# Patient Record
Sex: Female | Born: 2000 | Hispanic: No | Marital: Single | Smoking: Former smoker
Health system: Southern US, Community
[De-identification: ages and names within clinical notes are randomized; demographics above are authoritative.]

## PROBLEM LIST (undated history)

## (undated) DIAGNOSIS — F419 Anxiety disorder, unspecified: Secondary | ICD-10-CM

## (undated) DIAGNOSIS — N83209 Unspecified ovarian cyst, unspecified side: Secondary | ICD-10-CM

## (undated) DIAGNOSIS — Z8614 Personal history of Methicillin resistant Staphylococcus aureus infection: Secondary | ICD-10-CM

## (undated) DIAGNOSIS — Z09 Encounter for follow-up examination after completed treatment for conditions other than malignant neoplasm: Secondary | ICD-10-CM

## (undated) DIAGNOSIS — R569 Unspecified convulsions: Secondary | ICD-10-CM

## (undated) DIAGNOSIS — N83201 Unspecified ovarian cyst, right side: Secondary | ICD-10-CM

## (undated) DIAGNOSIS — Z8776 Personal history of (corrected) congenital malformations of integument, limbs and musculoskeletal system: Secondary | ICD-10-CM

## (undated) DIAGNOSIS — N83202 Unspecified ovarian cyst, left side: Secondary | ICD-10-CM

## (undated) DIAGNOSIS — Q793 Gastroschisis: Secondary | ICD-10-CM

## (undated) DIAGNOSIS — Z87761 Personal history of (corrected) gastroschisis: Secondary | ICD-10-CM

## (undated) DIAGNOSIS — M1990 Unspecified osteoarthritis, unspecified site: Secondary | ICD-10-CM

## (undated) DIAGNOSIS — T7840XA Allergy, unspecified, initial encounter: Secondary | ICD-10-CM

## (undated) DIAGNOSIS — J45909 Unspecified asthma, uncomplicated: Secondary | ICD-10-CM

## (undated) HISTORY — PX: APPENDECTOMY: SHX54

## (undated) HISTORY — PX: SMALL INTESTINE SURGERY: SHX150

## (undated) HISTORY — PX: ADENOIDECTOMY: SUR15

## (undated) HISTORY — PX: NOSE SURGERY: SHX723

## (undated) HISTORY — PX: GASTROSCHISIS CLOSURE: SHX1700

## (undated) HISTORY — PX: TONSILLECTOMY: SUR1361

---
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
## 2018-01-29 ENCOUNTER — Encounter: Payer: Self-pay | Admitting: Obstetrics and Gynecology

## 2018-01-29 ENCOUNTER — Ambulatory Visit (INDEPENDENT_AMBULATORY_CARE_PROVIDER_SITE_OTHER): Payer: No Typology Code available for payment source | Admitting: Obstetrics and Gynecology

## 2018-01-29 DIAGNOSIS — Z0000 Encounter for general adult medical examination without abnormal findings: Secondary | ICD-10-CM | POA: Diagnosis not present

## 2018-01-29 DIAGNOSIS — Z01411 Encounter for gynecological examination (general) (routine) with abnormal findings: Secondary | ICD-10-CM

## 2018-01-29 DIAGNOSIS — Z113 Encounter for screening for infections with a predominantly sexual mode of transmission: Secondary | ICD-10-CM

## 2018-01-29 NOTE — Progress Notes (Signed)
Patient ID: Sheryl Dixon, female   DOB: 23-Aug-2001, 17 y.o.   MRN: [id number]     Gynecology Annual Exam  PCP: [facility], [facility]  Chief Complaint:  Chief Complaint  Patient presents with  . Follow-up    endometriosis    History of Present Illness: Patient is a 17 y.o. No obstetric history on file. presents for annual exam. The patient has no complaints today.   LMP: Patient's last menstrual period was 01/29/2018 (exact date). Average Interval: regular, 28 days Intermenstrual Bleeding: no Postcoital Bleeding: no Dysmenorrhea: yes  The patient is sexually active. She currently uses none for contraception. She denies dyspareunia.  The patient does not perform self breast exams.  There is no notable family history of breast or ovarian cancer in her family.  The patient wears seatbelts: yes.  The patient has regular exercise: not asked.    The patient denies current symptoms of depression.        Obstetrics & Gynecology Office Visit   Chief Complaint:  Chief Complaint  Patient presents with  . Follow-up    endometriosis    17 y.o. G0P0010 presenting for discussion of additional management option. The etiology of her pelvic pain is laparoscopically proven endometriosis.  She is currently being managed with nothing, was trial on norethindrone.   The patient reports no improvement in symptoms.  She did follow up with [facility] to discuss management option, given her complicated surgical history and prior gastroschisis surgery recommendation was to pursue medical management.  The patient declined medical management at that time.   Review of Systems: ROS  Past Medical History:  Past Medical History:  Diagnosis Date  . Allergy   . Anxiety    NO MEDS BUT PT DOES SEE A COUNSELOR PER PTS MOM [name]  . Arthritis   . Asthma    AS A CHILD-NO INHALERS  . Bilateral ovarian cysts   . 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 Hx MRSA infection   . Ovarian cyst   . S/P gastrochisis  repair, follow-up exam   . Seizures (HCC)    LAST SEIZURE WHEN PT WAS < 63 YEAR OLD    Past Surgical History:  Past Surgical History:  Procedure Laterality Date  . ADENOIDECTOMY    . ADENOIDECTOMY     AGE 1 OR 8  . APPENDECTOMY    . GASTROSCHISIS CLOSURE     x 7   . NOSE SURGERY    . ROBOTIC ASSISTED LAPAROSCOPIC OVARIAN CYSTECTOMY Left 03/16/2016   Procedure: ROBOTIC ASSISTED LAPAROSCOPIC lysis of adhesions (extensive) including enterolysis and drainage of peritoneal inclusion cyst x2;  Surgeon: Elenora Fender Ward, MD;  Location: [facility] ORS;  Service: Gynecology;  Laterality: Left;  . SMALL INTESTINE SURGERY    . TONSILLECTOMY    . TONSILLECTOMY     AGE 1 OR 8    Gynecologic History:  Patient's last menstrual period was 01/29/2018 (exact date). Contraception: none Last Pap: Results were: N/A under 21  Obstetric History: No obstetric history on file.  Family History:  Family History  Problem Relation Age of Onset  . Diabetes Father   . Hyperlipidemia Father   . Hypertension Father     Social History:  Social History   Socioeconomic History  . Marital status: Single    Spouse name: Not on file  . Number of children: Not on file  . Years of education: Not on file  . Highest education level: Not on file  Occupational  History  . Not on file  Social Needs  . Financial resource strain: Not on file  . Food insecurity:    Worry: Not on file    Inability: Not on file  . Transportation needs:    Medical: Not on file    Non-medical: Not on file  Tobacco Use  . Smoking status: Current Some Day Smoker  . Smokeless tobacco: Never Used  Substance and Sexual Activity  . Alcohol use: No  . Drug use: Yes    Types: Marijuana  . Sexual activity: Yes    Partners: Male    Birth control/protection: None  Lifestyle  . Physical activity:    Days per week: Not on file    Minutes per session: Not on file  . Stress: Not on file  Relationships  . Social connections:    Talks on  phone: Not on file    Gets together: Not on file    Attends religious service: Not on file    Active member of club or organization: Not on file    Attends meetings of clubs or organizations: Not on file    Relationship status: Not on file  . Intimate partner violence:    Fear of current or ex partner: Not on file    Emotionally abused: Not on file    Physically abused: Not on file    Forced sexual activity: Not on file  Other Topics Concern  . Not on file  Social History Narrative   ** Merged History Encounter **        Allergies:  Allergies  Allergen Reactions  . Tylenol [Acetaminophen] Other (See Comments)    Body feels numb  . Penicillins Rash    Has patient had a PCN reaction causing immediate rash, facial/tongue/throat swelling, SOB or lightheadedness with hypotension: Unknown Has patient had a PCN reaction causing severe rash involving mucus membranes or skin necrosis: Unknown Has patient had a PCN reaction that required hospitalization: Unknown Has patient had a PCN reaction occurring within the last 10 years: Unknown If all of the above answers are "NO", then may proceed with Cephalosporin use.     Medications: Prior to Admission medications   Medication Sig Start Date End Date Taking? Authorizing Provider  sulfamethoxazole-trimethoprim (BACTRIM DS,SEPTRA DS) 800-160 MG tablet  01/24/18   [provider]    Physical Exam Vitals: Blood pressure 98/66, pulse 82, height  (1.702 m), weight 184 lb (83.5 kg), last menstrual period 01/29/2018.  General: NAD HEENT: normocephalic, anicteric Pulmonary: No increased work of breathing, CTAB Cardiovascular: RRR, distal pulses 2+ Abdomen:soft, non-tender, non-distended.  Umbilicus without lesions.  No hepatomegaly, splenomegaly or masses palpable. No evidence of hernia  Genitourinary:  External: Normal external female genitalia.  Normal urethral meatus, normal Bartholin's and Skene's glands.    Vagina: Normal  vaginal mucosa, no evidence of prolapse.    Cervix: Grossly normal in appearance, no bleeding  Uterus: Non-enlarged, mobile, normal contour.  No CMT  Adnexa: ovaries non-enlarged, no adnexal masses  Rectal: deferred  Lymphatic: no evidence of inguinal lymphadenopathy Extremities: no edema, erythema, or tenderness Neurologic: Grossly intact Psychiatric: mood appropriate, affect full  Female chaperone present for pelvic and breast  portions of the physical exam    Assessment: 17 y.o. No obstetric history on file. routine annual exam  Plan: Problem List Items Addressed This Visit    None    Visit Diagnoses    Encounter for gynecological examination with abnormal finding  Relevant Orders   GC/Chlamydia Probe Amp (genital or urine) ([facility] / [facility])   HEP, RPR, HIV Panel (Completed)   Routine screening for STI (sexually transmitted infection)       Relevant Orders   GC/Chlamydia Probe Amp (genital or urine) ([facility] / [facility])   HEP, RPR, HIV Panel (Completed)      1) Endometriosis with previously imaged endometrioma.  We discussed the following hormonal mangement options:  1) Norethindrone  2) Norethindrone and letrozole  Letrozole 2.5mg  daily in addition to  [facility] agonists, combination oral  contraceptives, or norethisterone  acetate has shown efficacy at  decreasing pain from  endometriosis.  There did not  appear to be a significant increase  in bone last after 6 months but  current studies have been limited to  6 months.    ACOG Committee Opinion Number 738 June 2018 "Aromatase Inhibitos in Gynecologic Practice"   3) Orilissa  4) Continuous oral contraceptive  5) Mirena IUD  2) STI screening  wasoffered and accepted  3)  ASCCP guidelines and rational discussed.  Patient opts for start at age 60 with 3 year screening interval  4) Contraception - the patient is currently using  none.    5) Return in 1 year (on 01/30/2019).   Vena Austria, MD, [facility], [facility] 01/29/2018, 2:42 PM

## 2018-01-29 NOTE — Patient Instructions (Addendum)
1) Norethindrone 2) Norethindrone and letrozole Letrozole 2.5mg  daily in addition to [facility] agonists, combination oral contraceptives, or norethisterone acetate has shown efficacy at decreasing pain from endometriosis.  There did not appear to be a significant increase in bone last after 6 months but current studies have been limited to 6 months.    ACOG Committee Opinion Number 738 June 2018 "Aromatase Inhibitos in Gynecologic Practice"  3) Orilissa 4) Continuous oral contraceptive 5) Mirena IUD

## 2018-01-30 ENCOUNTER — Telehealth: Payer: Self-pay

## 2018-01-30 LAB — HEP, RPR, HIV PANEL
HEP B S AG: NEGATIVE
HIV Screen 4th Generation wRfx: NONREACTIVE
RPR Ser Ql: NONREACTIVE

## 2018-01-30 NOTE — Telephone Encounter (Signed)
Pt calling triage today and states that she had very heavy bleeding yesterday when she came in for appt, so she thought it was her period. But then today she said she has not had any bleeding at all. Wants to speak with AMS nurse and see what she or AMS think about this.

## 2018-01-30 NOTE — Telephone Encounter (Signed)
Please advise 

## 2018-01-30 NOTE — Telephone Encounter (Signed)
So I'd advise a pregnancy test if she had bleeding and then it stopped

## 2018-01-31 LAB — GC/CHLAMYDIA PROBE AMP
Chlamydia trachomatis, NAA: POSITIVE — AB
Neisseria gonorrhoeae by PCR: NEGATIVE

## 2018-01-31 NOTE — Telephone Encounter (Signed)
LVM for patient to call me back 

## 2018-01-31 NOTE — Telephone Encounter (Signed)
I will let patient know you are not in the office today. Please advise

## 2018-01-31 NOTE — Telephone Encounter (Signed)
PT aware of AMS message

## 2018-01-31 NOTE — Telephone Encounter (Signed)
AMS pt states she was bleeding really heavy on Monday then it stopped completely. Pt is aware AMS advised to take a pregnancy test today. However, today the bleeding started back really heavy and it's clumpy. Pt uncertain if she should still take the test or what this bleeding could possibly mean. Cb#[phone]

## 2018-02-01 ENCOUNTER — Other Ambulatory Visit: Payer: Self-pay | Admitting: Obstetrics and Gynecology

## 2018-02-01 MED ORDER — AZITHROMYCIN 500 MG PO TABS: 1000.0000 mg | ORAL_TABLET | Freq: Once | ORAL | 0 refills | Status: DC

## 2018-02-06 ENCOUNTER — Telehealth: Payer: Self-pay | Admitting: Obstetrics and Gynecology

## 2018-02-06 ENCOUNTER — Other Ambulatory Visit: Payer: Self-pay | Admitting: Obstetrics and Gynecology

## 2018-02-06 MED ORDER — AZITHROMYCIN 500 MG PO TABS: 1000.0000 mg | ORAL_TABLET | Freq: Once | ORAL | 0 refills | Status: AC

## 2018-02-06 NOTE — Telephone Encounter (Signed)
Patient is calling to follow up on Medication. Patient received missed call. There is no documenting in regards to patient being notified. Please call patient

## 2018-02-06 NOTE — Telephone Encounter (Signed)
Rx was sent to pharmacy last week, pt threw them up because she took them on an empty stomach. Pt needs a new Rx sent in. [facility] [place]

## 2018-09-28 ENCOUNTER — Ambulatory Visit (INDEPENDENT_AMBULATORY_CARE_PROVIDER_SITE_OTHER): Payer: Medicaid Other | Admitting: Obstetrics and Gynecology

## 2018-09-28 ENCOUNTER — Other Ambulatory Visit (HOSPITAL_COMMUNITY)
Admission: RE | Admit: 2018-09-28 | Discharge: 2018-09-28 | Disposition: A | Discharge status: Home / Self Care | Payer: No Typology Code available for payment source | Source: Ambulatory Visit | Attending: Obstetrics and Gynecology | Admitting: Obstetrics and Gynecology

## 2018-09-28 ENCOUNTER — Encounter: Payer: Self-pay | Admitting: Obstetrics and Gynecology

## 2018-09-28 VITALS — BP 100/58 | HR 88 | Ht 67.0 in | Wt 177.0 lb

## 2018-09-28 DIAGNOSIS — Z113 Encounter for screening for infections with a predominantly sexual mode of transmission: Secondary | ICD-10-CM

## 2018-09-28 DIAGNOSIS — Z3009 Encounter for other general counseling and advice on contraception: Secondary | ICD-10-CM | POA: Diagnosis not present

## 2018-09-28 NOTE — Progress Notes (Signed)
Obstetrics & Gynecology Office Visit   Chief Complaint:  Chief Complaint  Patient presents with  . Annual Exam    Referred by [facility] pediatrics    History of Present Illness: Patient is a 18 y.o. African American female presenting for contraception consult at the request of her pediatrician.  She is currently on condoms and desiring to start none.  She has a past medical history significant for no contraindication to estrogen.  She specifically denies a history of migraine with aura, chronic hypertension, history of DVT/PE and smoking.  Reported Patient's last menstrual period was 09/17/2018.Marland Kitchen    Review of Systems: Review of Systems  Constitutional: Negative.   Gastrointestinal: Negative.   Genitourinary: Negative.   Skin: Negative.      Past Medical History:  Past Medical History:  Diagnosis Date  . Allergy   . Anxiety    NO MEDS BUT PT DOES SEE A COUNSELOR PER PTS MOM [name]  . Arthritis   . Asthma    AS A CHILD-NO INHALERS  . Bilateral ovarian cysts   . 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 Hx MRSA infection   . Ovarian cyst   . S/P gastrochisis repair, follow-up exam   . Seizures (HCC)    LAST SEIZURE WHEN PT WAS < 62 YEAR OLD    Past Surgical History:  Past Surgical History:  Procedure Laterality Date  . ADENOIDECTOMY    . ADENOIDECTOMY     AGE 68 OR 8  . APPENDECTOMY    . GASTROSCHISIS CLOSURE     x 7   . NOSE SURGERY    . ROBOTIC ASSISTED LAPAROSCOPIC OVARIAN CYSTECTOMY Left 03/16/2016   Procedure: ROBOTIC ASSISTED LAPAROSCOPIC lysis of adhesions (extensive) including enterolysis and drainage of peritoneal inclusion cyst x2;  Surgeon: Elenora Fender Ward, MD;  Location: [facility] ORS;  Service: Gynecology;  Laterality: Left;  . SMALL INTESTINE SURGERY    . TONSILLECTOMY    . TONSILLECTOMY     AGE 68 OR 8    Gynecologic History: Patient's last menstrual period was 09/17/2018.  Obstetric History: No obstetric history on file.  Family History:  Family  History  Problem Relation Age of Onset  . Diabetes Father   . Hyperlipidemia Father   . Hypertension Father     Social History:  Social History   Socioeconomic History  . Marital status: Single    Spouse name: Not on file  . Number of children: Not on file  . Years of education: Not on file  . Highest education level: Not on file  Occupational History  . Not on file  Social Needs  . Financial resource strain: Not on file  . Food insecurity:    Worry: Not on file    Inability: Not on file  . Transportation needs:    Medical: Not on file    Non-medical: Not on file  Tobacco Use  . Smoking status: Current Some Day Smoker  . Smokeless tobacco: Never Used  Substance and Sexual Activity  . Alcohol use: No  . Drug use: Not Currently    Types: Marijuana  . Sexual activity: Yes    Partners: Male    Birth control/protection: None  Lifestyle  . Physical activity:    Days per week: Not on file    Minutes per session: Not on file  . Stress: Not on file  Relationships  . Social connections:    Talks on phone: Not on file  Gets together: Not on file    Attends religious service: Not on file    Active member of club or organization: Not on file    Attends meetings of clubs or organizations: Not on file    Relationship status: Not on file  . Intimate partner violence:    Fear of current or ex partner: Not on file    Emotionally abused: Not on file    Physically abused: Not on file    Forced sexual activity: Not on file  Other Topics Concern  . Not on file  Social History Narrative   ** Merged History Encounter **        Allergies:  Allergies  Allergen Reactions  . Tylenol [Acetaminophen] Other (See Comments)    Body feels numb  . Penicillins Rash    Has patient had a PCN reaction causing immediate rash, facial/tongue/throat swelling, SOB or lightheadedness with hypotension: Unknown Has patient had a PCN reaction causing severe rash involving mucus membranes or  skin necrosis: Unknown Has patient had a PCN reaction that required hospitalization: Unknown Has patient had a PCN reaction occurring within the last 10 years: Unknown If all of the above answers are "NO", then may proceed with Cephalosporin use.     Medications: Prior to Admission medications   Not on File    Physical Exam Vitals:  Vitals:   09/28/18 0934  BP: (!) 100/58  Pulse: 88   Patient's last menstrual period was 09/17/2018.  General: NAD HEENT: normocephalic, anicteric Pulmonary: No increased work of breathing Genitourinary:  External: Normal external female genitalia.  Normal urethral meatus, normal Bartholin's and Skene's glands.    Vagina: Normal vaginal mucosa, no evidence of prolapse.    Cervix: Grossly normal in appearance, no bleeding   Rectal: deferred Extremities: no edema, erythema, or tenderness Neurologic: Grossly intact Psychiatric: mood appropriate, affect full  Female chaperone present for pelvic  portions of the physical exam  Assessment: 18 y.o. presenting for contraception consult  Plan: Problem List Items Addressed This Visit    None    Visit Diagnoses    Routine screening for STI (sexually transmitted infection)    -  Primary   Encounter for counseling regarding contraception       Relevant Orders   Cervicovaginal ancillary only   HEP, RPR, HIV Panel (Completed)     1) Reviewed all forms of birth control options available including abstinence; over the counter/barrier methods; hormonal contraceptive medication including pill, patch, ring, injection,contraceptive implant; hormonal and nonhormonal IUDs; permanent sterilization options including vasectomy and the various tubal sterilization modalities. Risks and benefits reviewed.  Questions were answered.  Information was given to patient to review.  - the patient is not interested in starting hormonal contraception at present - discussed importance of safe sex not only for contraception  but because of STI risk reduction  2) STI screening - offered and accepted (would like call on results)  3) Gardasil - patient is unsure if she has completed Gardasil series.  Given handout on Gardasil-9  4) A total of 15 minutes were spent in face-to-face contact with the patient during this encounter with over half of that time devoted to counseling and coordination of care.  5) Return in about 1 year (around 09/29/2019) for annual.   Vena AustriaAndreas Rayyan Burley, MD, Merlinda FrederickFACOG [facility], [facility]

## 2018-09-29 LAB — HEP, RPR, HIV PANEL
HIV Screen 4th Generation wRfx: NONREACTIVE
Hepatitis B Surface Ag: NEGATIVE
RPR: NONREACTIVE

## 2018-10-01 LAB — CERVICOVAGINAL ANCILLARY ONLY
CHLAMYDIA, DNA PROBE: POSITIVE — AB
Neisseria Gonorrhea: NEGATIVE
TRICH (WINDOWPATH): NEGATIVE

## 2018-10-02 ENCOUNTER — Telehealth: Payer: Self-pay

## 2018-10-02 ENCOUNTER — Other Ambulatory Visit: Payer: Self-pay | Admitting: Obstetrics and Gynecology

## 2018-10-02 MED ORDER — AZITHROMYCIN 500 MG PO TABS: 1000.0000 mg | ORAL_TABLET | Freq: Once | ORAL | 0 refills | Status: AC

## 2018-10-02 NOTE — Telephone Encounter (Signed)
Pt called triage today wanting to speak with AMS. She wanted to ask if the BV she had a while ago wasn't treated if that could be the reason for what she has now. CB#[phone]

## 2018-12-21 ENCOUNTER — Other Ambulatory Visit: Payer: Self-pay

## 2018-12-21 DIAGNOSIS — B9689 Other specified bacterial agents as the cause of diseases classified elsewhere: Secondary | ICD-10-CM | POA: Insufficient documentation

## 2018-12-21 DIAGNOSIS — F17200 Nicotine dependence, unspecified, uncomplicated: Secondary | ICD-10-CM | POA: Insufficient documentation

## 2018-12-21 DIAGNOSIS — J45909 Unspecified asthma, uncomplicated: Secondary | ICD-10-CM | POA: Insufficient documentation

## 2018-12-21 DIAGNOSIS — N83201 Unspecified ovarian cyst, right side: Secondary | ICD-10-CM | POA: Insufficient documentation

## 2018-12-21 DIAGNOSIS — N760 Acute vaginitis: Secondary | ICD-10-CM | POA: Insufficient documentation

## 2018-12-22 ENCOUNTER — Emergency Department (HOSPITAL_COMMUNITY)
Admission: EM | Admit: 2018-12-22 | Discharge: 2018-12-22 | Disposition: A | Discharge status: Home / Self Care | Payer: Self-pay | Attending: Emergency Medicine | Admitting: Emergency Medicine

## 2018-12-22 ENCOUNTER — Encounter (HOSPITAL_COMMUNITY): Payer: Self-pay

## 2018-12-22 ENCOUNTER — Other Ambulatory Visit: Payer: Self-pay

## 2018-12-22 ENCOUNTER — Emergency Department (HOSPITAL_COMMUNITY): Payer: Self-pay

## 2018-12-22 DIAGNOSIS — N760 Acute vaginitis: Secondary | ICD-10-CM

## 2018-12-22 DIAGNOSIS — R102 Pelvic and perineal pain: Secondary | ICD-10-CM

## 2018-12-22 DIAGNOSIS — B9689 Other specified bacterial agents as the cause of diseases classified elsewhere: Secondary | ICD-10-CM

## 2018-12-22 DIAGNOSIS — N83201 Unspecified ovarian cyst, right side: Secondary | ICD-10-CM

## 2018-12-22 LAB — WET PREP, GENITAL
Sperm: NONE SEEN
Trich, Wet Prep: NONE SEEN
Yeast Wet Prep HPF POC: NONE SEEN

## 2018-12-22 LAB — URINALYSIS, ROUTINE W REFLEX MICROSCOPIC
Bilirubin Urine: NEGATIVE
Glucose, UA: NEGATIVE mg/dL
Hgb urine dipstick: NEGATIVE
Ketones, ur: NEGATIVE mg/dL
Leukocytes,Ua: NEGATIVE
Nitrite: NEGATIVE
Protein, ur: NEGATIVE mg/dL
Specific Gravity, Urine: 1.014 (ref 1.005–1.030)
pH: 6 (ref 5.0–8.0)

## 2018-12-22 LAB — COMPREHENSIVE METABOLIC PANEL
ALT: 12 U/L (ref 0–44)
AST: 14 U/L — ABNORMAL LOW (ref 15–41)
Albumin: 4.2 g/dL (ref 3.5–5.0)
Alkaline Phosphatase: 59 U/L (ref 38–126)
Anion gap: 7 (ref 5–15)
BUN: 9 mg/dL (ref 6–20)
CO2: 25 mmol/L (ref 22–32)
Calcium: 9.4 mg/dL (ref 8.9–10.3)
Chloride: 105 mmol/L (ref 98–111)
Creatinine, Ser: 0.59 mg/dL (ref 0.44–1.00)
GFR calc Af Amer: >60 mL/min (ref >60)
GFR calc non Af Amer: >60 mL/min (ref >60)
Glucose, Bld: 101 mg/dL — ABNORMAL HIGH (ref 70–99)
Potassium: 3.4 mmol/L — ABNORMAL LOW (ref 3.5–5.1)
Sodium: 137 mmol/L (ref 135–145)
Total Bilirubin: 0.9 mg/dL (ref 0.3–1.2)
Total Protein: 7.8 g/dL (ref 6.5–8.1)

## 2018-12-22 LAB — CBC
HCT: 38 % (ref 36.0–46.0)
Hemoglobin: 12.6 g/dL (ref 12.0–15.0)
MCH: 27.5 pg (ref 26.0–34.0)
MCHC: 33.2 g/dL (ref 30.0–36.0)
MCV: 82.8 fL (ref 80.0–100.0)
Platelets: 239 10*3/uL (ref 150–400)
RBC: 4.59 MIL/uL (ref 3.87–5.11)
RDW: 13.5 % (ref 11.5–15.5)
WBC: 11.2 10*3/uL — ABNORMAL HIGH (ref 4.0–10.5)
nRBC: 0 % (ref 0.0–0.2)

## 2018-12-22 LAB — I-STAT BETA HCG BLOOD, ED (MC, WL, AP ONLY): I-stat hCG, quantitative: <5 m[IU]/mL (ref <5)

## 2018-12-22 LAB — LIPASE, BLOOD: Lipase: 27 U/L (ref 11–51)

## 2018-12-22 MED ORDER — MORPHINE SULFATE (PF) 4 MG/ML IV SOLN
4.0000 mg | Freq: Once | INTRAVENOUS | Status: AC
  Administered 2018-12-22: 01:00:00 4 mg via INTRAVENOUS
  Filled 2018-12-22: qty 1

## 2018-12-22 MED ORDER — IBUPROFEN 800 MG PO TABS: 800.0000 mg | ORAL_TABLET | Freq: Three times a day (TID) | ORAL | 0 refills | Status: DC

## 2018-12-22 MED ORDER — METRONIDAZOLE 500 MG PO TABS: 500.0000 mg | ORAL_TABLET | Freq: Two times a day (BID) | ORAL | 0 refills | Status: DC

## 2018-12-22 MED ORDER — TRAMADOL HCL 50 MG PO TABS: 50.0000 mg | ORAL_TABLET | Freq: Four times a day (QID) | ORAL | 0 refills | Status: DC | PRN

## 2018-12-22 MED ORDER — ONDANSETRON HCL 4 MG/2ML IJ SOLN
4.0000 mg | Freq: Once | INTRAMUSCULAR | Status: AC
  Administered 2018-12-22: 4 mg via INTRAVENOUS
  Filled 2018-12-22: qty 2

## 2018-12-22 NOTE — ED Provider Notes (Signed)
[facility]-EMERGENCY DEPT Provider Note   CSN: [id number] Arrival date & time: 12/21/18  2358    History   Chief Complaint Chief Complaint  Patient presents with   Abdominal Pain    HPI Sheryl Dixon is a 18 y.o. female.     The history is provided by the patient and medical records.  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  States this started around [employer]7PM tonight.  Pain worse with standing up or lying flat, better with legs drawn towards her chest.  She reports she is starting to get nauseated but no vomiting.  No fever/chills.  Denies any urinary symptoms.  No vaginal discharge.  She was treated for chlamydia earlier this year at GYN visit.  She tried aleve and heating pad at home without relief.  States she has had some pelvic pain over the past few months, tried to make an appt with her OB last month but unable to be seen due to pandemic.  States she has hx of TOA 3 years ago, pain feels similar to that.  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    Hx MRSA infection    Ovarian cyst    S/P gastrochisis repair, follow-up exam    Seizures (HCC)    LAST SEIZURE WHEN PT WAS < 18 YEAR OLD    Patient Active Problem List   Diagnosis Date Noted   Tubo-ovarian abscess 03/14/2015   Suicidal ideations 01/16/2014   Deliberate self-cutting 01/16/2014   Cluster B personality disorder (HCC) 01/16/2014   PMDD (premenstrual dysphoric disorder) 01/16/2014   MDD (major depressive disorder), single episode, severe (HCC) 01/15/2014    Past Surgical History:    Procedure Laterality Date   ADENOIDECTOMY     ADENOIDECTOMY     AGE 52 OR 8   APPENDECTOMY     GASTROSCHISIS CLOSURE     x 7    NOSE SURGERY     ROBOTIC ASSISTED LAPAROSCOPIC OVARIAN CYSTECTOMY Left 03/16/2016   Procedure: ROBOTIC ASSISTED LAPAROSCOPIC lysis of adhesions (extensive) including enterolysis and drainage of peritoneal inclusion cyst x2;  Surgeon: Elenora Fenderhelsea C Ward, MD;  Location: [facility] ORS;  Service: Gynecology;  Laterality: Left;   SMALL INTESTINE SURGERY     TONSILLECTOMY     TONSILLECTOMY     AGE 52 OR 8     OB History    Gravida  0   Para  0   Term  0   Preterm  0   AB  0   Living        SAB  0   TAB  0   Ectopic  0   Multiple      Live Births               Home Medications    Prior to Admission medications   Not on File    Family History Family History  Problem Relation Age of Onset   Diabetes Father    Hyperlipidemia Father  Hypertension Father     Social History Social History   Tobacco Use   Smoking status: Current Some Day Smoker   Smokeless tobacco: Never Used  Substance Use Topics   Alcohol use: No   Drug use: Not Currently    Types: Marijuana     Allergies   Tylenol [acetaminophen] and Penicillins   Review of Systems Review of Systems  Gastrointestinal: Positive for abdominal pain and nausea.  All other systems reviewed and are negative.    Physical Exam Updated Vital Signs BP 115/73 (BP Location: Left Arm)    Pulse 96    Temp 99.1 F (37.3 C) (Oral)    Resp 18    Ht  (1.702 m)    Wt 80.3 kg    SpO2 99%    BMI 27.72 kg/m   Physical Exam Vitals signs and nursing note reviewed.  Constitutional:      Appearance: She is well-developed.  HENT:     Head: Normocephalic and atraumatic.  Eyes:     Conjunctiva/sclera: Conjunctivae normal.     Pupils: Pupils are equal, round, and reactive to light.  Neck:     Musculoskeletal: Normal range of motion.  Cardiovascular:     Rate and  Rhythm: Normal rate and regular rhythm.     Heart sounds: Normal heart sounds.  Pulmonary:     Effort: Pulmonary effort is normal.     Breath sounds: Normal breath sounds.  Abdominal:     General: Bowel sounds are normal.     Palpations: Abdomen is soft.     Tenderness: There is abdominal tenderness in the right lower quadrant.       Comments: Tenderness in RLQ/pelvis; no apparent distention, bowel sounds are normal  Genitourinary:    Comments: Exam chaperoned by NT Normal female external genitalia without visible lesions or rash; small amount of thick, white vaginal discharge noted; cervical os closed, no friability or bleeding; tenderness of right adnexa without fullyness or masss, no tenderness of left adnexa, no CMT Musculoskeletal: Normal range of motion.  Skin:    General: Skin is warm and dry.  Neurological:     Mental Status: She is alert and oriented to person, place, and time.      ED Treatments / Results  Labs (all labs ordered are listed, but only abnormal results are displayed) Labs Reviewed  WET PREP, GENITAL - Abnormal; Notable for the following components:      Result Value   Clue Cells Wet Prep HPF POC PRESENT (*)    WBC, Wet Prep HPF POC MODERATE (*)    All other components within normal limits  COMPREHENSIVE METABOLIC PANEL - Abnormal; Notable for the following components:   Potassium 3.4 (*)    Glucose, Bld 101 (*)    AST 14 (*)    All other components within normal limits  CBC - Abnormal; Notable for the following components:   WBC 11.2 (*)    All other components within normal limits  LIPASE, BLOOD  URINALYSIS, ROUTINE W REFLEX MICROSCOPIC  I-STAT BETA HCG BLOOD, ED ([facility], [facility], [facility] ONLY)  GC/CHLAMYDIA PROBE AMP ([place]) NOT AT [facility]    EKG None  Radiology US Pelvis Transvanginal Non-ob (tv Only)  Result Date: 12/22/2018 CLINICAL DATA:  Right-sided pelvic pain. EXAM: TRANSABDOMINAL AND TRANSVAGINAL ULTRASOUND OF PELVIS DOPPLER ULTRASOUND OF  OVARIES TECHNIQUE: Both transabdominal and transvaginal ultrasound examinations of the pelvis were performed. Transabdominal technique was performed for global imaging of the pelvis including  uterus, ovaries, adnexal regions, and pelvic cul-de-sac. It was necessary to proceed with endovaginal exam following the transabdominal exam to visualize the uterus and adnexa. Color and duplex Doppler ultrasound was utilized to evaluate blood flow to the ovaries. COMPARISON:  Pelvic ultrasound 11/11/2017, pelvic MRI 03/05/2016 FINDINGS: Uterus Measurements: 8.7 x 4.7 x 6.3 cm = volume: 134 mL. Uterus is anteverted. No fibroids or other mass visualized. Endometrium Thickness: 9 mm, normal.  No focal abnormality visualized. Right ovary Measurements: 4.5 x 3.4 x 2 8 cm = volume: 22.3 mL. Within the right ovary is a complex cyst measuring 3.7 x 2.5 x 2.3 cm with lacy internal echo and likely clot retraction. Normal vascularity to the ovarian parenchyma. No adnexal mass. Left ovary Measurements: 8.3 x 4.7 x 5.1 cm = volume: 103 mL. Two rounded echogenic lesions in the left ovary measuring 5.1 x 4.1 x 4.7 cm and 3.3 x 3.4 x 4.1 cm. These are consistent with endometriomas. Normal blood flow seen to the ovarian parenchyma. Pulsed Doppler evaluation of both ovaries demonstrates normal low-resistance arterial and venous waveforms. Other findings No abnormal free fluid. IMPRESSION: 1. Hemorrhagic cyst in the right ovary measuring 3.7 cm. 2. Left ovarian endometriomas which are similar to prior ultrasound and MRI. 3. Normal blood flow to both ovaries without torsion. Electronically Signed   By: Narda Rutherford M.D.   On: 12/22/2018 02:29   US Pelvis Complete  Result Date: 12/22/2018 CLINICAL DATA:  Right-sided pelvic pain. EXAM: TRANSABDOMINAL AND TRANSVAGINAL ULTRASOUND OF PELVIS DOPPLER ULTRASOUND OF OVARIES TECHNIQUE: Both transabdominal and transvaginal ultrasound examinations of the pelvis were performed. Transabdominal  technique was performed for global imaging of the pelvis including uterus, ovaries, adnexal regions, and pelvic cul-de-sac. It was necessary to proceed with endovaginal exam following the transabdominal exam to visualize the uterus and adnexa. Color and duplex Doppler ultrasound was utilized to evaluate blood flow to the ovaries. COMPARISON:  Pelvic ultrasound 11/11/2017, pelvic MRI 03/05/2016 FINDINGS: Uterus Measurements: 8.7 x 4.7 x 6.3 cm = volume: 134 mL. Uterus is anteverted. No fibroids or other mass visualized. Endometrium Thickness: 9 mm, normal.  No focal abnormality visualized. Right ovary Measurements: 4.5 x 3.4 x 2 8 cm = volume: 22.3 mL. Within the right ovary is a complex cyst measuring 3.7 x 2.5 x 2.3 cm with lacy internal echo and likely clot retraction. Normal vascularity to the ovarian parenchyma. No adnexal mass. Left ovary Measurements: 8.3 x 4.7 x 5.1 cm = volume: 103 mL. Two rounded echogenic lesions in the left ovary measuring 5.1 x 4.1 x 4.7 cm and 3.3 x 3.4 x 4.1 cm. These are consistent with endometriomas. Normal blood flow seen to the ovarian parenchyma. Pulsed Doppler evaluation of both ovaries demonstrates normal low-resistance arterial and venous waveforms. Other findings No abnormal free fluid. IMPRESSION: 1. Hemorrhagic cyst in the right ovary measuring 3.7 cm. 2. Left ovarian endometriomas which are similar to prior ultrasound and MRI. 3. Normal blood flow to both ovaries without torsion. Electronically Signed   By: Narda Rutherford M.D.   On: 12/22/2018 02:29   US Pelvic Doppler (torsion R/o Or Mass Arterial Flow)  Result Date: 12/22/2018 CLINICAL DATA:  Right-sided pelvic pain. EXAM: TRANSABDOMINAL AND TRANSVAGINAL ULTRASOUND OF PELVIS DOPPLER ULTRASOUND OF OVARIES TECHNIQUE: Both transabdominal and transvaginal ultrasound examinations of the pelvis were performed. Transabdominal technique was performed for global imaging of the pelvis including uterus, ovaries, adnexal  regions, and pelvic cul-de-sac. It was necessary to proceed with endovaginal exam following the transabdominal  exam to visualize the uterus and adnexa. Color and duplex Doppler ultrasound was utilized to evaluate blood flow to the ovaries. COMPARISON:  Pelvic ultrasound 11/11/2017, pelvic MRI 03/05/2016 FINDINGS: Uterus Measurements: 8.7 x 4.7 x 6.3 cm = volume: 134 mL. Uterus is anteverted. No fibroids or other mass visualized. Endometrium Thickness: 9 mm, normal.  No focal abnormality visualized. Right ovary Measurements: 4.5 x 3.4 x 2 8 cm = volume: 22.3 mL. Within the right ovary is a complex cyst measuring 3.7 x 2.5 x 2.3 cm with lacy internal echo and likely clot retraction. Normal vascularity to the ovarian parenchyma. No adnexal mass. Left ovary Measurements: 8.3 x 4.7 x 5.1 cm = volume: 103 mL. Two rounded echogenic lesions in the left ovary measuring 5.1 x 4.1 x 4.7 cm and 3.3 x 3.4 x 4.1 cm. These are consistent with endometriomas. Normal blood flow seen to the ovarian parenchyma. Pulsed Doppler evaluation of both ovaries demonstrates normal low-resistance arterial and venous waveforms. Other findings No abnormal free fluid. IMPRESSION: 1. Hemorrhagic cyst in the right ovary measuring 3.7 cm. 2. Left ovarian endometriomas which are similar to prior ultrasound and MRI. 3. Normal blood flow to both ovaries without torsion. Electronically Signed   By: Narda Rutherford M.D.   On: 12/22/2018 02:29    Procedures Procedures (including critical care time)  Medications Ordered in ED Medications  morphine 4 MG/ML injection 4 mg (4 mg Intravenous Given 12/22/18 0051)  ondansetron (ZOFRAN) injection 4 mg (4 mg Intravenous Given 12/22/18 0051)     Initial Impression / Assessment and Plan / ED Course  I have reviewed the triage vital signs and the nursing notes.  Pertinent labs & imaging results that were available during my care of the patient were reviewed by me and considered in my medical decision  making (see chart for details).  18 y.o. F here with right lower abdominal pain, onset 7pm.  Some nausea now but no vomiting.  No fever, chills, urinary symptoms, or vaginal discharge.  She is afebrile and nontoxic in appearance.  Does have some tenderness in the right lower quadrant but seems more localized to the right pelvis.  There is no peritoneal signs.  Screening labs are overall reassuring, blood cell count 11.2.  Urinalysis is negative.  Pelvic exam was performed, does have some thick, white vaginal discharge noted as well as some tenderness of the right adnexa.  There is no cervical motion tenderness or friability.  Wet prep does reveal clue cells.  Gc/chl pending.  Pelvic ultrasound was obtained and does reveal right hemorrhagic ovarian cyst, this is likely etiology of her symptoms.  Patient will be treated symptomatically as well as course of Flagyl for BV.  She has tested positive for chlamydia multiple times in the past, was treated for this in February.  She denies concerns for STD at this time.  I did discuss with her prophylactic treatment with Rocephin and azithromycin in the ED now, she prefers to wait for her results.  She will need to follow-up with her OB-GYN.  She can return here for any new/acute changes.  Final Clinical Impressions(s) / ED Diagnoses   Final diagnoses:  Pelvic pain  Hemorrhagic cyst of right ovary  Bacterial vaginosis    ED Discharge Orders         Ordered    traMADol (ULTRAM) 50 MG tablet  Every 6 hours PRN     12/22/18 0240    ibuprofen (ADVIL,MOTRIN) 800 MG tablet  3 times  daily     12/22/18 0240    metroNIDAZOLE (FLAGYL) 500 MG tablet  2 times daily     12/22/18 0240           Garlon Hatchet, PA-C 12/22/18 0253    Nira Conn, MD 12/22/18 [phone]

## 2018-12-22 NOTE — Discharge Instructions (Signed)
Take the prescribed medication as directed.  Symptoms should gradually start improving. Follow-up with your OB-GYN.   Return to the ED for new or worsening symptoms.

## 2018-12-22 NOTE — ED Triage Notes (Signed)
Pt reports RLQ abdominal pain that started around 7p. She states that the pain worsens when she stands up. Denies vomiting or diarrhea. No urinary symptoms. A&Ox4.

## 2018-12-24 LAB — GC/CHLAMYDIA PROBE AMP (~~LOC~~) NOT AT ARMC
Chlamydia: NEGATIVE
Neisseria Gonorrhea: NEGATIVE

## 2019-03-31 ENCOUNTER — Emergency Department
Admission: EM | Admit: 2019-03-31 | Discharge: 2019-03-31 | Disposition: A | Discharge status: Home / Self Care | Payer: HRSA Program | Attending: Emergency Medicine | Admitting: Emergency Medicine

## 2019-03-31 ENCOUNTER — Other Ambulatory Visit: Payer: Self-pay

## 2019-03-31 ENCOUNTER — Encounter: Payer: Self-pay | Admitting: Emergency Medicine

## 2019-03-31 DIAGNOSIS — Z20828 Contact with and (suspected) exposure to other viral communicable diseases: Secondary | ICD-10-CM | POA: Diagnosis not present

## 2019-03-31 DIAGNOSIS — Z87891 Personal history of nicotine dependence: Secondary | ICD-10-CM | POA: Insufficient documentation

## 2019-03-31 DIAGNOSIS — J45909 Unspecified asthma, uncomplicated: Secondary | ICD-10-CM | POA: Insufficient documentation

## 2019-03-31 DIAGNOSIS — J029 Acute pharyngitis, unspecified: Secondary | ICD-10-CM | POA: Diagnosis present

## 2019-03-31 LAB — GROUP A STREP BY PCR: Group A Strep by PCR: NOT DETECTED

## 2019-03-31 LAB — SARS CORONAVIRUS 2 BY RT PCR (HOSPITAL ORDER, PERFORMED IN ~~LOC~~ HOSPITAL LAB): SARS Coronavirus 2: NEGATIVE

## 2019-03-31 NOTE — ED Provider Notes (Signed)
[place] EMERGENCY DEPARTMENT Provider Note   CSN: [id number] Arrival date & time: 03/31/19  1807    History   Chief Complaint Chief Complaint  Patient presents with  . Sore Throat    HPI Sheryl Dixon is a 18 y.o. female presents emergency department for evaluation of 1 to 2 weeks sore throat with mild headache.  Patient states a week ago she was in contact with 2 patients who tested positive for COVID.  She denies any fever, cough, shortness of breath, abdominal pain, chest pain, nausea vomiting or diarrhea.  She has not been taking medications for symptoms.  She is tolerating p.o. well.  She denies any vision changes, rashes.  She is allergic Tylenol.     HPI  Past Medical History:  Diagnosis Date  . Allergy   . Anxiety    NO MEDS BUT PT DOES SEE A COUNSELOR PER PTS MOM [name]  . Arthritis   . Asthma    AS A CHILD-NO INHALERS  . Bilateral ovarian cysts   . 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 Hx MRSA infection   . Ovarian cyst   . S/P gastrochisis repair, follow-up exam   . Seizures (HCC)    LAST SEIZURE WHEN PT WAS < 52 YEAR OLD    Patient Active Problem List   Diagnosis Date Noted  . Tubo-ovarian abscess 03/14/2015  . Suicidal ideations 01/16/2014  . Deliberate self-cutting 01/16/2014  . Cluster B personality disorder ([place]) 01/16/2014  . PMDD (premenstrual dysphoric disorder) 01/16/2014  . MDD (major depressive disorder), single episode, severe ([place]) 01/15/2014    Past Surgical History:  Procedure Laterality Date  . ADENOIDECTOMY    . ADENOIDECTOMY     AGE 5 OR 8  . APPENDECTOMY    . GASTROSCHISIS CLOSURE     x 7   . NOSE SURGERY    . ROBOTIC ASSISTED LAPAROSCOPIC OVARIAN CYSTECTOMY Left 03/16/2016   Procedure: ROBOTIC ASSISTED LAPAROSCOPIC lysis of adhesions (extensive) including enterolysis and drainage of peritoneal inclusion cyst x2;  Surgeon: Honor Loh Ward, MD;  Location: [facility] ORS;  Service: Gynecology;  Laterality: Left;  .  SMALL INTESTINE SURGERY    . TONSILLECTOMY    . TONSILLECTOMY     AGE 5 OR 8     OB History    Gravida  0   Para  0   Term  0   Preterm  0   AB  0   Living        SAB  0   TAB  0   Ectopic  0   Multiple      Live Births               Home Medications    Prior to Admission medications   Medication Sig Start Date End Date Taking? Authorizing Provider  albuterol (VENTOLIN HFA) 108 (90 Base) MCG/ACT inhaler Inhale 2 puffs into the lungs every 4 (four) hours as needed for wheezing. 11/02/10   [provider]  ibuprofen (ADVIL,MOTRIN) 800 MG tablet Take 1 tablet (800 mg total) by mouth 3 (three) times daily. 12/22/18   Larene Pickett, PA-C  metroNIDAZOLE (FLAGYL) 500 MG tablet Take 1 tablet (500 mg total) by mouth 2 (two) times daily. 12/22/18   Larene Pickett, PA-C  traMADol (ULTRAM) 50 MG tablet Take 1 tablet (50 mg total) by mouth every 6 (six) hours as needed. 12/22/18   Larene Pickett, PA-C  Family History Family History  Problem Relation Age of Onset  . Diabetes Father   . Hyperlipidemia Father   . Hypertension Father     Social History Social History   Tobacco Use  . Smoking status: Former Games developermoker  . Smokeless tobacco: Never Used  Substance Use Topics  . Alcohol use: No  . Drug use: Not Currently    Types: Marijuana     Allergies   Tylenol [acetaminophen] and Penicillins   Review of Systems Review of Systems  Constitutional: Negative for chills and fever.  HENT: Positive for sore throat. Negative for congestion, ear discharge, rhinorrhea, sinus pressure, sinus pain, trouble swallowing and voice change.   Respiratory: Negative for cough, shortness of breath, wheezing and stridor.   Cardiovascular: Negative for chest pain.  Gastrointestinal: Negative for abdominal pain, diarrhea, nausea and vomiting.  Genitourinary: Negative for dysuria, flank pain and pelvic pain.  Musculoskeletal: Negative for back pain and myalgias.  Skin:  Negative for rash.  Neurological: Positive for headaches. Negative for dizziness.     Physical Exam Updated Vital Signs BP 112/75   Pulse 89   Temp 98.9 F (37.2 C)   Resp 18   Ht 5\' 7"  (1.702 m)   Wt 74.8 kg   LMP  (Within Weeks)   SpO2 98%   BMI 25.84 kg/m   Physical Exam Constitutional:      Appearance: She is well-developed.  HENT:     Head: Normocephalic and atraumatic.     Nose: No congestion.     Mouth/Throat:     Mouth: Mucous membranes are moist. No oral lesions.     Pharynx: Oropharynx is clear. Uvula midline. Posterior oropharyngeal erythema present. No pharyngeal swelling or uvula swelling.  Eyes:     Conjunctiva/sclera: Conjunctivae normal.  Neck:     Musculoskeletal: Normal range of motion.  Cardiovascular:     Rate and Rhythm: Normal rate.     Heart sounds: Normal heart sounds.  Pulmonary:     Effort: Pulmonary effort is normal. No respiratory distress.  Musculoskeletal: Normal range of motion.  Skin:    General: Skin is warm.     Findings: No rash.  Neurological:     Mental Status: She is alert and oriented to person, place, and time.  Psychiatric:        Behavior: Behavior normal.        Thought Content: Thought content normal.      ED Treatments / Results  Labs (all labs ordered are listed, but only abnormal results are displayed) Labs Reviewed  GROUP A STREP BY PCR  SARS CORONAVIRUS 2 ([facility]OSPITAL ORDER[facility])    EKG None  Radiology No results found.  Procedures Procedures (including critical care time)  Medications Ordered in ED Medications - No data to display   Initial Impression / Assessment and Plan / ED Course  I have reviewed the triage vital signs and the nursing notes.  Pertinent labs & imaging results that were available during my care of the patient were reviewed by me and considered in my medical decision making (see chart for details).        18 year old female with sore  throat, mild headache.  No known exposure to positive COVID patients.  Strep test is negative, cover test pending.  She will self isolate until cover test back.  Vital signs are stable afebrile with no cough or shortness of breath.  She understands signs symptoms return to ED for.  Final Clinical Impressions(s) / ED Diagnoses   Final diagnoses:  Pharyngitis, unspecified etiology    ED Discharge Orders    None       Ronnette JuniperGaines, Yadhira Mckneely C, PA-C 03/31/19 2250    Arnaldo NatalMalinda, Paul F, MD 04/01/19 [phone]

## 2019-03-31 NOTE — Discharge Instructions (Addendum)
Please gargle with warm salt water, alternate Tylenol and ibuprofen as needed for pain.  Return to the ER for any worsening symptoms or urgent changes in your health such as difficulty swallowing, fevers, chest pain or shortness of breath.

## 2019-03-31 NOTE — ED Notes (Signed)
See triage note. Pt states she has had a sore throat since last week. Pt was exposed to friends grandparents for approx 2 hours, 2 weeks ago who have since tested positive. Upon assessment, Pt A&Ox4, NAD, no respiratory Sx evident.

## 2019-03-31 NOTE — ED Triage Notes (Signed)
Pt to ER with c/o sore throat and headache.  States sore throat x 1 week and headache x 2 weeks.  Pt was around someone last week that tested pos for Covid. Pt denies fever, cough or SHOB.

## 2019-05-14 IMAGING — US US PELVIS COMPLETE
1 series · 13 of 25 positions shown · non-contrast
Comparison: Pelvic ultrasound 11/11/2017, pelvic MRI 03/05/2016

CLINICAL DATA: Right-sided pelvic pain.

EXAM:
TRANSABDOMINAL AND TRANSVAGINAL ULTRASOUND OF PELVIS
DOPPLER ULTRASOUND OF OVARIES
TECHNIQUE: Both transabdominal and transvaginal ultrasound examinations of the
pelvis were performed. Transabdominal technique was performed for
global imaging of the pelvis including uterus, ovaries, adnexal
regions, and pelvic cul-de-sac.
It was necessary to proceed with endovaginal exam following the
transabdominal exam to visualize the uterus and adnexa. Color and
duplex Doppler ultrasound was utilized to evaluate blood flow to the
ovaries.

[Series 1: us pelvis complete · 56 acquisitions, 13 frames shown]
[im 1/56]
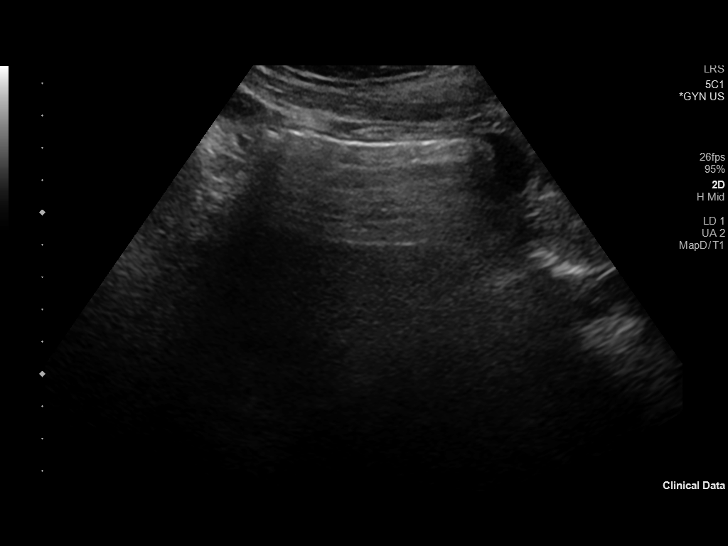
[im 5/56]
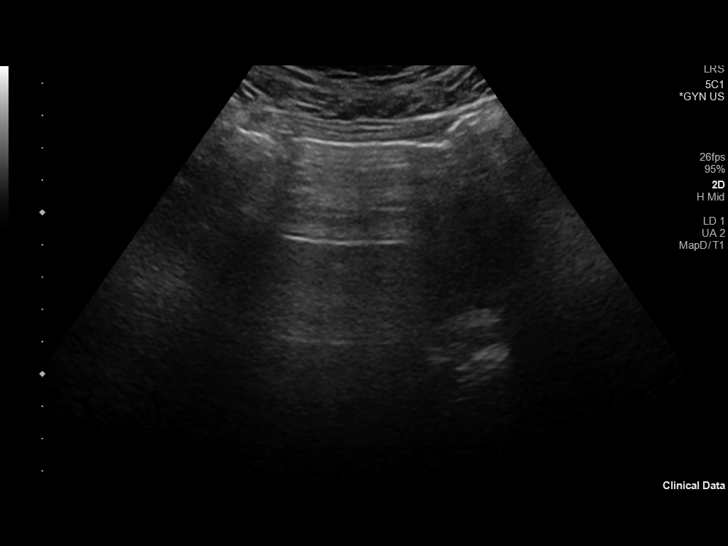
[im 10/56]
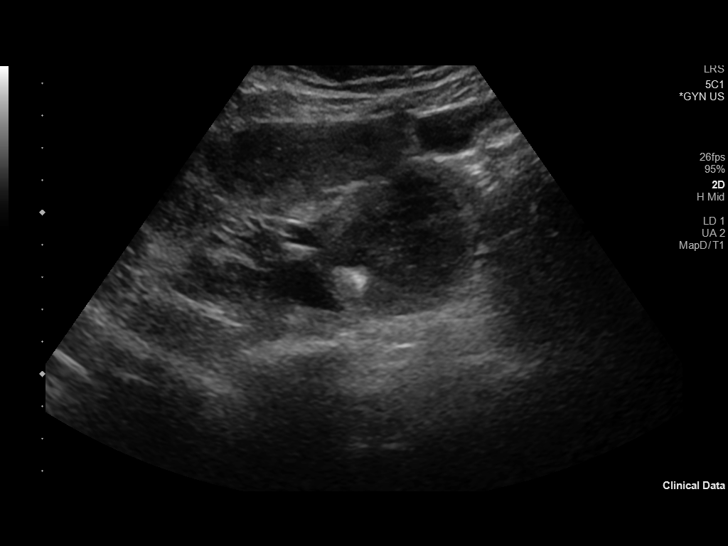
[im 14/56]
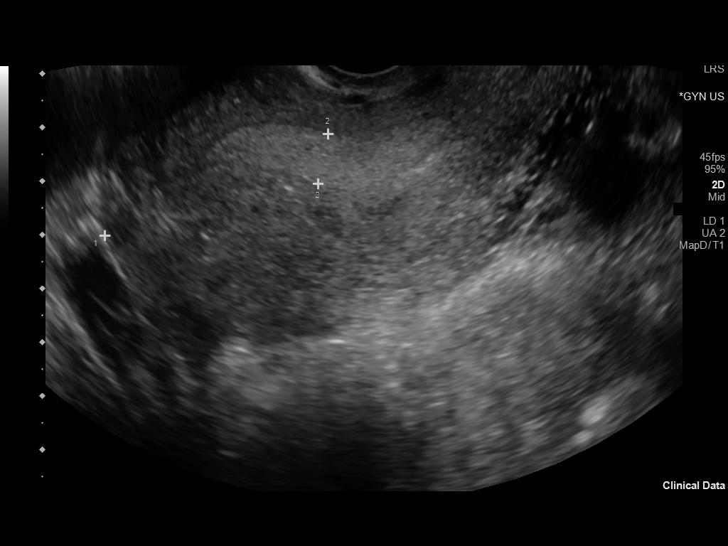
[im 19/56]
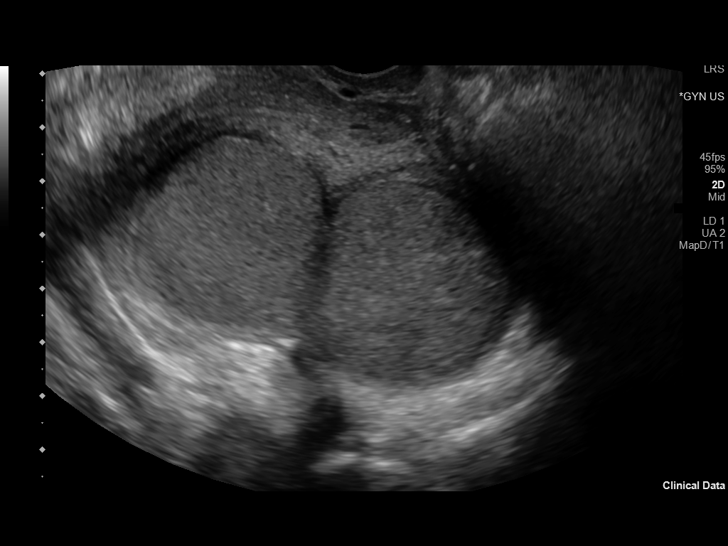
[im 23/56]
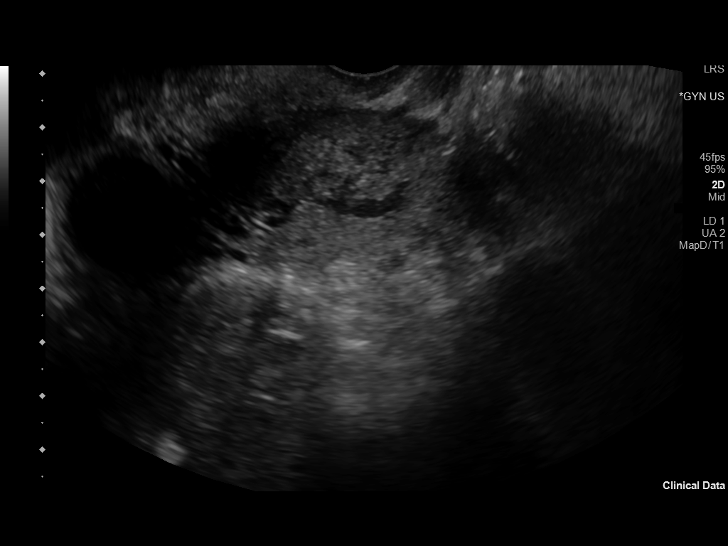
[im 28/56]
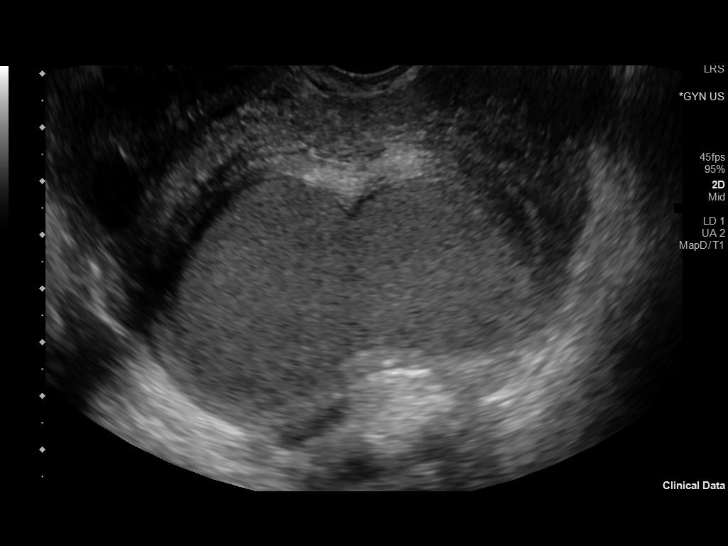
[im 33/56]
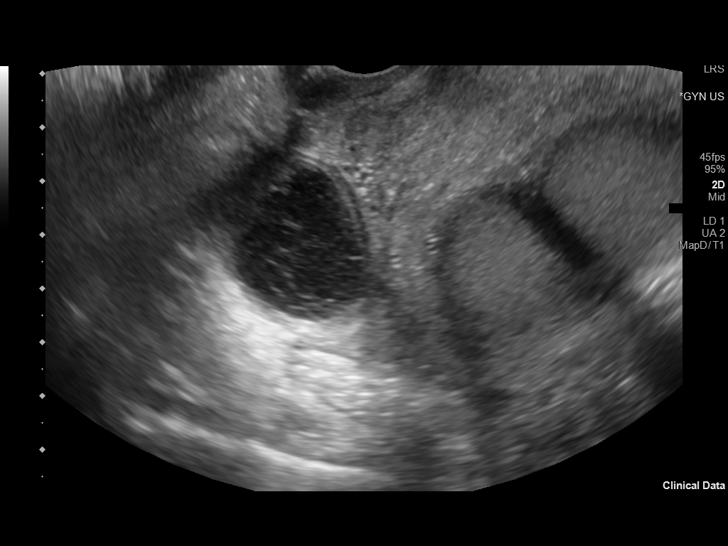
[im 37/56]
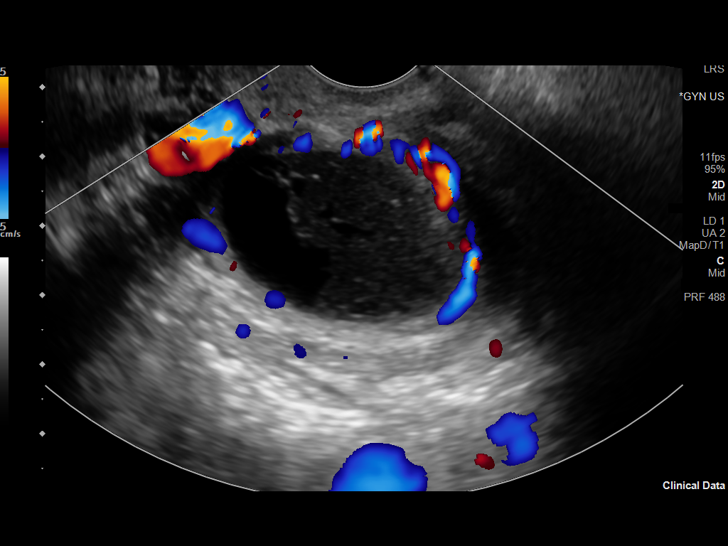
[im 42/56]
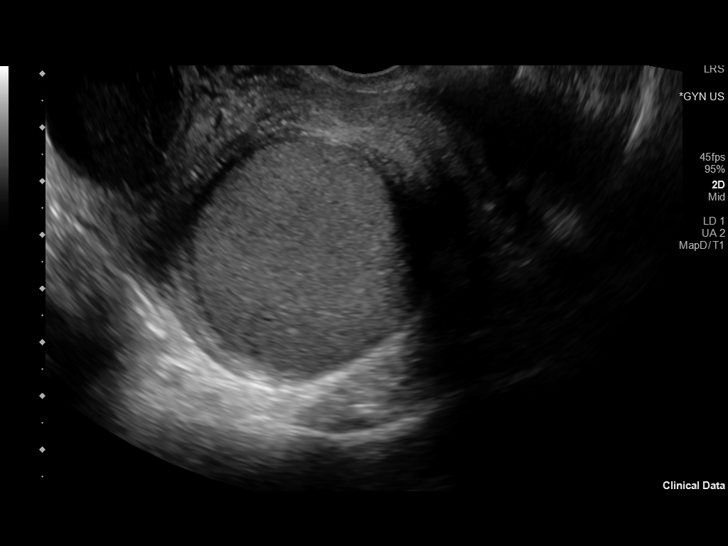
[im 46/56]
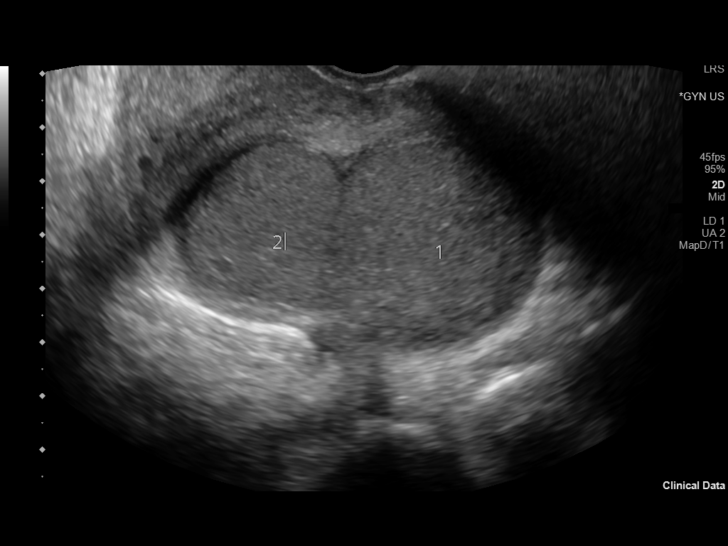
[im 51/56]
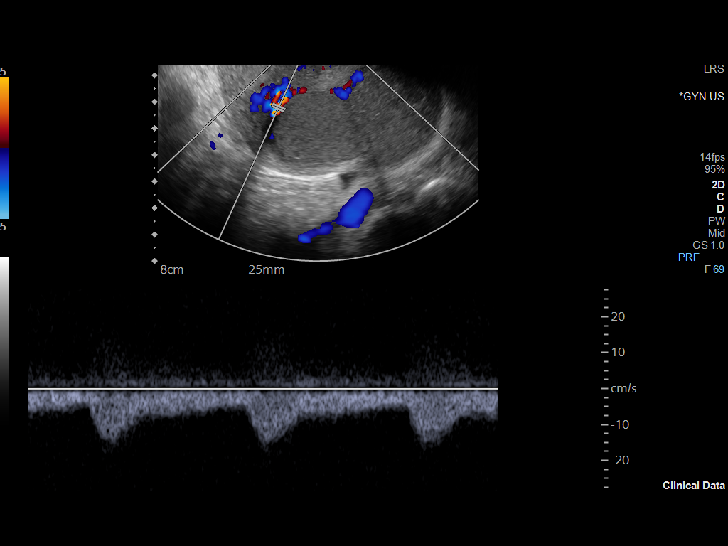
[im 56/56]
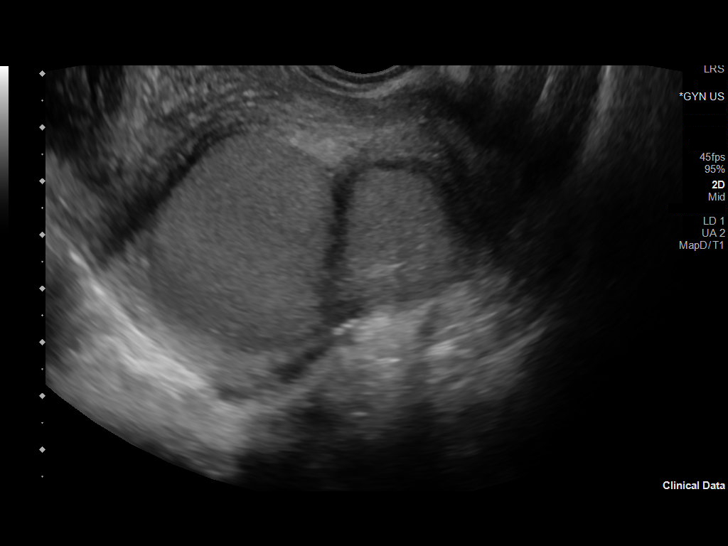

[13 of 25 positions shown; findings below may reference images not displayed]

FINDINGS: Uterus

Measurements: 8.7 x 4.7 x 6.3 cm = volume: 134 mL. Uterus is
anteverted. No fibroids or other mass visualized.

Endometrium

Thickness: 9 mm, normal.  No focal abnormality visualized.

Right ovary

Measurements: 4.5 x 3.4 x 2 8 cm = volume: 22.3 mL. Within the right
ovary is a complex cyst measuring 3.7 x 2.5 x 2.3 cm with [place]
internal echo and likely clot retraction. Normal vascularity to the
ovarian parenchyma. No adnexal mass.

Left ovary

Measurements: 8.3 x 4.7 x 5.1 cm = volume: 103 mL. Two rounded
echogenic lesions in the left ovary measuring 5.1 x 4.1 x 4.7 cm and
3.3 x 3.4 x 4.1 cm. These are consistent with endometriomas. Normal
blood flow seen to the ovarian parenchyma.

Pulsed Doppler evaluation of both ovaries demonstrates normal
low-resistance arterial and venous waveforms.

Other findings

No abnormal free fluid.
IMPRESSION: 1. Hemorrhagic cyst in the right ovary measuring 3.7 cm.
2. Left ovarian endometriomas which are similar to prior ultrasound
and MRI.
3. Normal blood flow to both ovaries without torsion.

## 2019-05-24 ENCOUNTER — Emergency Department
Admission: EM | Admit: 2019-05-24 | Discharge: 2019-05-24 | Discharge status: Against Medical Advice | Payer: Medicaid Other | Attending: Emergency Medicine | Admitting: Emergency Medicine

## 2019-05-24 ENCOUNTER — Encounter: Payer: Self-pay | Admitting: Emergency Medicine

## 2019-05-24 ENCOUNTER — Other Ambulatory Visit: Payer: Self-pay

## 2019-05-24 DIAGNOSIS — S60512A Abrasion of left hand, initial encounter: Secondary | ICD-10-CM | POA: Insufficient documentation

## 2019-05-24 DIAGNOSIS — S61511A Laceration without foreign body of right wrist, initial encounter: Secondary | ICD-10-CM | POA: Insufficient documentation

## 2019-05-24 DIAGNOSIS — W25XXXA Contact with sharp glass, initial encounter: Secondary | ICD-10-CM | POA: Insufficient documentation

## 2019-05-24 DIAGNOSIS — Y929 Unspecified place or not applicable: Secondary | ICD-10-CM | POA: Insufficient documentation

## 2019-05-24 DIAGNOSIS — S61412A Laceration without foreign body of left hand, initial encounter: Secondary | ICD-10-CM | POA: Insufficient documentation

## 2019-05-24 DIAGNOSIS — Y939 Activity, unspecified: Secondary | ICD-10-CM | POA: Insufficient documentation

## 2019-05-24 DIAGNOSIS — Y999 Unspecified external cause status: Secondary | ICD-10-CM | POA: Insufficient documentation

## 2019-05-24 DIAGNOSIS — Z5321 Procedure and treatment not carried out due to patient leaving prior to being seen by health care provider: Secondary | ICD-10-CM | POA: Insufficient documentation

## 2019-05-24 DIAGNOSIS — Z79899 Other long term (current) drug therapy: Secondary | ICD-10-CM | POA: Insufficient documentation

## 2019-05-24 DIAGNOSIS — F1210 Cannabis abuse, uncomplicated: Secondary | ICD-10-CM | POA: Insufficient documentation

## 2019-05-24 DIAGNOSIS — Y9389 Activity, other specified: Secondary | ICD-10-CM | POA: Insufficient documentation

## 2019-05-24 DIAGNOSIS — Z23 Encounter for immunization: Secondary | ICD-10-CM | POA: Insufficient documentation

## 2019-05-24 DIAGNOSIS — J45909 Unspecified asthma, uncomplicated: Secondary | ICD-10-CM | POA: Insufficient documentation

## 2019-05-24 LAB — COMPREHENSIVE METABOLIC PANEL
ALT: 18 U/L (ref 0–44)
AST: 20 U/L (ref 15–41)
Albumin: 4.6 g/dL (ref 3.5–5.0)
Alkaline Phosphatase: 60 U/L (ref 38–126)
Anion gap: 9 (ref 5–15)
BUN: 13 mg/dL (ref 6–20)
CO2: 25 mmol/L (ref 22–32)
Calcium: 9.6 mg/dL (ref 8.9–10.3)
Chloride: 106 mmol/L (ref 98–111)
Creatinine, Ser: 0.83 mg/dL (ref 0.44–1.00)
GFR calc Af Amer: >60 mL/min (ref >60)
GFR calc non Af Amer: >60 mL/min (ref >60)
Glucose, Bld: 87 mg/dL (ref 70–99)
Potassium: 3.8 mmol/L (ref 3.5–5.1)
Sodium: 140 mmol/L (ref 135–145)
Total Bilirubin: 0.9 mg/dL (ref 0.3–1.2)
Total Protein: 8.1 g/dL (ref 6.5–8.1)

## 2019-05-24 LAB — CBC WITH DIFFERENTIAL/PLATELET
Abs Immature Granulocytes: 0.01 10*3/uL (ref 0.00–0.07)
Basophils Absolute: 0 10*3/uL (ref 0.0–0.1)
Basophils Relative: 0 %
Eosinophils Absolute: 0 10*3/uL (ref 0.0–0.5)
Eosinophils Relative: 0 %
HCT: 40.2 % (ref 36.0–46.0)
Hemoglobin: 13.7 g/dL (ref 12.0–15.0)
Immature Granulocytes: 0 %
Lymphocytes Relative: 6 %
Lymphs Abs: 0.6 10*3/uL — ABNORMAL LOW (ref 0.7–4.0)
MCH: 27.5 pg (ref 26.0–34.0)
MCHC: 34.1 g/dL (ref 30.0–36.0)
MCV: 80.7 fL (ref 80.0–100.0)
Monocytes Absolute: 0.5 10*3/uL (ref 0.1–1.0)
Monocytes Relative: 6 %
Neutro Abs: 7.7 10*3/uL (ref 1.7–7.7)
Neutrophils Relative %: 88 %
Platelets: 270 10*3/uL (ref 150–400)
RBC: 4.98 MIL/uL (ref 3.87–5.11)
RDW: 13.5 % (ref 11.5–15.5)
WBC: 8.8 10*3/uL (ref 4.0–10.5)
nRBC: 0 % (ref 0.0–0.2)

## 2019-05-24 LAB — LACTIC ACID, PLASMA: Lactic Acid, Venous: 1.1 mmol/L (ref 0.5–1.9)

## 2019-05-24 MED ORDER — IBUPROFEN 400 MG PO TABS: 400.0000 mg | ORAL_TABLET | Freq: Once | ORAL | Status: DC

## 2019-05-24 NOTE — ED Triage Notes (Addendum)
Patient presents to the ED with lacerations to knuckles on left hand and right wrist.  Patient states she punched glass with both hands.  Bleeding is under control at this time.  Patient is in no obvious distress.  Patient denies cough, nausea, vomiting, loss of taste or smell.  Per mother, "it's hereditary, we always get a fever when we get tired."

## 2019-05-24 NOTE — ED Triage Notes (Signed)
Pt presents to ED with lacerations  to knuckles on left hand and right forearm, pt reports she punched a mirror with both hands, pt is not in obvious distress, pt talks in complete sentences no respiratory distress noted. Pt unsure if tdap is up to date

## 2019-05-24 NOTE — ED Notes (Signed)
Patient called for a room times three with no answer. 

## 2019-05-24 NOTE — ED Notes (Signed)
Rn called pt several times, Rn walked outside and called pt did not answer, RN wanted to ck VS

## 2019-05-25 ENCOUNTER — Emergency Department
Admission: EM | Admit: 2019-05-25 | Discharge: 2019-05-25 | Disposition: A | Discharge status: Home / Self Care | Payer: Medicaid Other | Source: Home / Self Care | Attending: Emergency Medicine | Admitting: Emergency Medicine

## 2019-05-25 ENCOUNTER — Emergency Department: Payer: Medicaid Other

## 2019-05-25 DIAGNOSIS — S60519A Abrasion of unspecified hand, initial encounter: Secondary | ICD-10-CM

## 2019-05-25 DIAGNOSIS — S61511A Laceration without foreign body of right wrist, initial encounter: Secondary | ICD-10-CM

## 2019-05-25 MED ORDER — TETANUS-DIPHTH-ACELL PERTUSSIS 5-2.5-18.5 LF-MCG/0.5 IM SUSP
0.5000 mL | Freq: Once | INTRAMUSCULAR | Status: AC
  Administered 2019-05-25: 0.5 mL via INTRAMUSCULAR
  Filled 2019-05-25: qty 0.5

## 2019-05-25 MED ORDER — BACITRACIN ZINC 500 UNIT/GM EX OINT
TOPICAL_OINTMENT | Freq: Once | CUTANEOUS | Status: DC
  Filled 2019-05-25: qty 2.7

## 2019-05-25 MED ORDER — LIDOCAINE HCL (PF) 1 % IJ SOLN
INTRAMUSCULAR | Status: AC
  Filled 2019-05-25: qty 5

## 2019-05-25 NOTE — ED Notes (Signed)
Wounds cleaned and dressed.

## 2019-05-25 NOTE — ED Provider Notes (Signed)
[facility] Emergency Department Provider Note   ____________________________________________   First MD Initiated Contact with Patient 05/25/19 [phone]     (approximate)  I have reviewed the triage vital signs and the nursing notes.   HISTORY  Chief Complaint Laceration (Right forearm, left knuckles )    HPI Sheryl Dixon is a 18 y.o. female who presents to the ED from home with a chief complaint of lacerations to both hands and right forearm.  Patient reports she punched a mirror out of anger with both hands.  She denies active SI.  Unknown tetanus.  Patient is right-handed.      Past Medical History:  Diagnosis Date  . Allergy   . Anxiety    NO MEDS BUT PT DOES SEE A COUNSELOR PER PTS MOM [name]  . Arthritis   . Asthma    AS A CHILD-NO INHALERS  . Bilateral ovarian cysts   . 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 Hx MRSA infection   . Ovarian cyst   . S/P gastrochisis repair, follow-up exam   . Seizures (HCC)    LAST SEIZURE WHEN PT WAS < 18 YEAR OLD    Patient Active Problem List   Diagnosis Date Noted  . Tubo-ovarian abscess 03/14/2015  . Suicidal ideations 01/16/2014  . Deliberate self-cutting 01/16/2014  . Cluster B personality disorder (HCC) 01/16/2014  . PMDD (premenstrual dysphoric disorder) 01/16/2014  . MDD (major depressive disorder), single episode, severe (HCC) 01/15/2014    Past Surgical History:  Procedure Laterality Date  . ADENOIDECTOMY    . ADENOIDECTOMY     AGE 64 OR 8  . APPENDECTOMY    . GASTROSCHISIS CLOSURE     x 7   . NOSE SURGERY    . ROBOTIC ASSISTED LAPAROSCOPIC OVARIAN CYSTECTOMY Left 03/16/2016   Procedure: ROBOTIC ASSISTED LAPAROSCOPIC lysis of adhesions (extensive) including enterolysis and drainage of peritoneal inclusion cyst x2;  Surgeon: Elenora Fenderhelsea C Ward, MD;  Location: [facility] ORS;  Service: Gynecology;  Laterality: Left;  . SMALL INTESTINE SURGERY    . TONSILLECTOMY    . TONSILLECTOMY     AGE 64 OR 8    Prior to Admission medications   Medication Sig Start Date End Date Taking? Authorizing Provider  albuterol (VENTOLIN HFA) 108 (90 Base) MCG/ACT inhaler Inhale 2 puffs into the lungs every 4 (four) hours as needed for wheezing. 11/02/10   [provider]  ibuprofen (ADVIL,MOTRIN) 800 MG tablet Take 1 tablet (800 mg total) by mouth 3 (three) times daily. 12/22/18   Garlon HatchetSanders, Lisa M, PA-C  metroNIDAZOLE (FLAGYL) 500 MG tablet Take 1 tablet (500 mg total) by mouth 2 (two) times daily. 12/22/18   Garlon HatchetSanders, Lisa M, PA-C  traMADol (ULTRAM) 50 MG tablet Take 1 tablet (50 mg total) by mouth every 6 (six) hours as needed. 12/22/18   Garlon HatchetSanders, Lisa M, PA-C    Allergies Tylenol [acetaminophen] and Penicillins  Family History  Problem Relation Age of Onset  . Diabetes Father   . Hyperlipidemia Father   . Hypertension Father     Social History Social History   Tobacco Use  . Smoking status: Former Games developermoker  . Smokeless tobacco: Never Used  Substance Use Topics  . Alcohol use: No  . Drug use: Not Currently    Types: Marijuana    Review of Systems  Constitutional: No fever/chills Eyes: No visual changes. ENT: No sore throat. Cardiovascular: Denies chest pain. Respiratory: Denies shortness of breath. Gastrointestinal: No  abdominal pain.  No nausea, no vomiting.  No diarrhea.  No constipation. Genitourinary: Negative for dysuria. Musculoskeletal: Positive for lacerations and abrasions to both hands and right forearm.  Negative for back pain. Skin: Negative for rash. Neurological: Negative for headaches, focal weakness or numbness.   ____________________________________________   PHYSICAL EXAM:  VITAL SIGNS: ED Triage Vitals  Enc Vitals Group     BP 05/24/19 2333 106/77     Pulse Rate 05/24/19 2333 (!) 115     Resp 05/24/19 2333 20     Temp 05/24/19 2333 98.6 F (37 C)     Temp Source 05/24/19 2333 Oral     SpO2 05/24/19 2333 96 %     Weight 05/24/19 2334 175 lb (79.4 kg)      Height 05/24/19 2334 5\' 7"  (1.702 m)     Head Circumference --      Peak Flow --      Pain Score 05/24/19 2334 8     Pain Loc --      Pain Edu? --      Excl. in GC? --     Constitutional: Alert and oriented. Well appearing and in no acute distress. Eyes: Conjunctivae are normal. PERRL. EOMI. Head: Atraumatic. Nose: No congestion/rhinnorhea. Mouth/Throat: Mucous membranes are moist.  Oropharynx non-erythematous. Neck: No stridor.  No cervical spine tenderness to palpation. Cardiovascular: Normal rate, regular rhythm. Grossly normal heart sounds.  Good peripheral circulation. Respiratory: Normal respiratory effort.  No retractions. Lungs CTAB. Gastrointestinal: Soft and nontender. No distention. No abdominal bruits. No CVA tenderness. Musculoskeletal:  RUE: Abrasions to dorsum of 2nd-5th IP joints.  Approximately 2 cm curved laceration to right medial wrist without active bleeding; adipose tissue noted.  2+ radial pulse.  Brisk, less than 5-second capillary refill. LUE: Abrasions to dorsum of 2nd-5th IP joints.  2+ radial pulse.  Brisk, less than 5-second capillary refill. No lower extremity tenderness nor edema.  No joint effusions. Neurologic:  Normal speech and language. No gross focal neurologic deficits are appreciated. No gait instability. Skin:  Skin is warm, dry and intact. No rash noted. Psychiatric: Mood and affect are normal. Speech and behavior are normal.  ____________________________________________   LABS (all labs ordered are listed, but only abnormal results are displayed)  Labs Reviewed - No data to display ____________________________________________  EKG  None ____________________________________________  RADIOLOGY  ED MD interpretation: No radiopaque foreign body noted to bilateral hands or right forearm  Official radiology report(s): Dg Forearm Right  Result Date: 05/25/2019 CLINICAL DATA:  Laceration, evaluate for foreign body, glass EXAM: RIGHT  FOREARM - 2 VIEW COMPARISON:  None. FINDINGS: Question a mild soft tissue laceration along the dorsal aspect of the distal right forearm. No subcutaneous gas or foreign body. No subjacent osseous injury. Alignment at the elbow is grossly preserved. Mild negative ulnar variance, a developmental finding. Alignment of the wrist is otherwise preserved. IMPRESSION: Question mild soft tissue laceration along the dorsal aspect of the distal right forearm. No subjacent osseous injury or foreign body. Electronically Signed   By: Kreg ShropshirePrice  DeHay M.D.   On: 05/25/2019 03:01   Dg Hand Complete Left  Result Date: 05/25/2019 CLINICAL DATA:  Punched glass, evaluate for foreign body. EXAM: LEFT HAND - COMPLETE 3+ VIEW COMPARISON:  None. FINDINGS: Soft tissue laceration of the fifth digit at the level of the head of the fifth proximal phalanx. No radiopaque foreign body. No subjacent osseous injuries. Carpal arcs are maintained. No significant arthrosis. IMPRESSION: Soft tissue laceration of  the fifth digit without radiopaque foreign body or acute osseous abnormality. Electronically Signed   By: Kreg Shropshire M.D.   On: 05/25/2019 02:58   Dg Hand Complete Right  Result Date: 05/25/2019 CLINICAL DATA:  Punched glass, evaluate for foreign body EXAM: RIGHT HAND - COMPLETE 3+ VIEW COMPARISON:  None. FINDINGS: There is no evidence of fracture or dislocation. No radiographically evident laceration. No soft tissue gas or foreign body. Carpal arcs are maintained. No significant arthrosis. IMPRESSION: No radiographically evident laceration, foreign body or acute osseous abnormality. Electronically Signed   By: Kreg Shropshire M.D.   On: 05/25/2019 02:59    ____________________________________________   PROCEDURES  Procedure(s) performed (including Critical Care):  Marland KitchenMarland KitchenLaceration Repair  Date/Time: 05/25/2019 3:54 AM Performed by: Irean Hong, MD Authorized by: Irean Hong, MD   Consent:    Consent obtained:  Verbal    Consent given by:  Patient   Risks discussed:  Infection, pain, retained foreign body, poor cosmetic result and poor wound healing Anesthesia (see MAR for exact dosages):    Anesthesia method:  Local infiltration   Local anesthetic:  Lidocaine 1% w/o epi Laceration details:    Location:  Hand   Hand location:  R wrist   Length (cm):  2   Depth (mm):  3 Repair type:    Repair type:  Simple Pre-procedure details:    Preparation:  Patient was prepped and draped in usual sterile fashion and imaging obtained to evaluate for foreign bodies Exploration:    Hemostasis achieved with:  Direct pressure   Wound exploration: entire depth of wound probed and visualized     Contaminated: no   Treatment:    Area cleansed with:  Saline   Amount of cleaning:  Standard   Irrigation solution:  Sterile saline   Irrigation method:  Tap   Visualized foreign bodies/material removed: no   Skin repair:    Repair method:  Sutures   Suture size:  5-0   Suture material:  Nylon   Suture technique:  Simple interrupted   Number of sutures:  3 Approximation:    Approximation:  Loose Post-procedure details:    Dressing:  Sterile dressing and antibiotic ointment   Patient tolerance of procedure:  Tolerated well, no immediate complications     ____________________________________________   INITIAL IMPRESSION / ASSESSMENT AND PLAN / ED COURSE  As part of my medical decision making, I reviewed the following data within the electronic MEDICAL RECORD NUMBER Nursing notes reviewed and incorporated, Radiograph reviewed and Notes from prior ED visits     Sheryl Dixon was evaluated in Emergency Department on 05/25/2019 for the symptoms described in the history of present illness. She was evaluated in the context of the global COVID-19 pandemic, which necessitated consideration that the patient might be at risk for infection with the SARS-CoV-2 virus that causes COVID-19. Institutional protocols and algorithms that  pertain to the evaluation of patients at risk for COVID-19 are in a state of rapid change based on information released by regulatory bodies including the CDC and federal and state organizations. These policies and algorithms were followed during the patient's care in the ED.    18 year old female who presents with lacerations and abrasions to both hands and right forearm incurred after punching a mirror in anger.  Will update tetanus, obtain imaging for glass foreign body and suture repair.   Clinical Course as of May 24 499  Sat May 25, 2019  2355 Patient tolerated suture repair well.  Nursing to apply bacitracin over abrasions to knuckles and dressing.  Strict return precautions given.  Patient verbalizes understanding and agrees with plan of care.   [JS]    Clinical Course User Index [JS] Paulette Blanch, MD     ____________________________________________   FINAL CLINICAL IMPRESSION(S) / ED DIAGNOSES  Final diagnoses:  Laceration of right wrist, initial encounter  Abrasion of hand, unspecified laterality, initial encounter     ED Discharge Orders    None       Note:  This document was prepared using Dragon voice recognition software and may include unintentional dictation errors.   Paulette Blanch, MD 05/25/19 0500

## 2019-05-25 NOTE — ED Notes (Signed)
Patient to stat desk asking about wait time. Patient given an update on wait time. Patient verbalizes understanding.

## 2019-05-25 NOTE — Discharge Instructions (Addendum)
1.  Suture removal in 7 to 10 days. 2.  Keep wound clean and dry. 3.  Return to the ER for worsening symptoms, increased redness/swelling, purulent discharge or other concerns.

## 2019-06-11 ENCOUNTER — Ambulatory Visit
Admission: EM | Admit: 2019-06-11 | Discharge: 2019-06-11 | Disposition: A | Discharge status: Home / Self Care | Payer: No Typology Code available for payment source | Attending: Urgent Care | Admitting: Urgent Care

## 2019-06-11 ENCOUNTER — Encounter: Payer: Self-pay | Admitting: Emergency Medicine

## 2019-06-11 ENCOUNTER — Other Ambulatory Visit: Payer: Self-pay

## 2019-06-11 DIAGNOSIS — Z4802 Encounter for removal of sutures: Secondary | ICD-10-CM

## 2019-06-11 NOTE — ED Triage Notes (Signed)
Patient in office today for suture removal right wrist area. Pt was seen in ED

## 2019-06-11 NOTE — Discharge Instructions (Signed)
It was very nice seeing you today in clinic. Thank you for entrusting me with your care.   Monitor for signs and symptoms of infection, which would include increased redness, swelling, streaking, drainage, pain, and the development of a fever. I would recommended keeping some antibiotic ointment on the area for the next couple of days, in addition to keeping area covered while out in public.   Make arrangements to follow up with your regular doctor if you have any issues. If your symptoms/condition worsens, please seek follow up care either here or in the ER. Please remember, our [place] providers are "right here with you" when you need [place].   Again, it was my pleasure to take care of you today. Thank you for choosing our clinic. I hope that you start to feel better quickly.   Honor Loh, MSN, APRN, FNP-C, CEN Advanced Practice Provider [facility]

## 2019-06-11 NOTE — ED Provider Notes (Signed)
[place], [place]   Name: Sheryl GuerinOsean Szuch DOB: [birth year] MRN: [id number] CSN: [id number] PCP: Patient, No Pcp Per  Arrival date and time:  06/11/19 1349  Chief Complaint:  Suture / Staple Removal   NOTE: Prior to seeing the patient today, I have reviewed the triage nursing documentation and vital signs. Clinical staff has updated patient's PMH/PSHx, current medication list, and drug allergies/intolerances to ensure comprehensive history available to assist in medical decision making.   History:   HPI: Sheryl Dixon is a 18 y.o. female who presents today with requests for suture removal. Patient had sutures placed to RIGHT volar wrist on 05/25/2019 at [facility] emergency department by Dr. Chiquita LothJade Sung after "punching some glass" citing that she was angry. Since sutures placed, patient has not appreciated any increased pain, erythema, or drainage. She has not experienced any fevers.   Past Medical History:  Diagnosis Date  . Allergy   . Anxiety    NO MEDS BUT PT DOES SEE A COUNSELOR PER PTS MOM [name]  . Arthritis   . Asthma    AS A CHILD-NO INHALERS  . Bilateral ovarian cysts   . 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 Hx MRSA infection   . Ovarian cyst   . S/P gastrochisis repair, follow-up exam   . Seizures (HCC)    LAST SEIZURE WHEN PT WAS < 18 YEAR OLD    Past Surgical History:  Procedure Laterality Date  . ADENOIDECTOMY    . ADENOIDECTOMY     AGE 42 OR 8  . APPENDECTOMY    . GASTROSCHISIS CLOSURE     x 7   . NOSE SURGERY    . ROBOTIC ASSISTED LAPAROSCOPIC OVARIAN CYSTECTOMY Left 03/16/2016   Procedure: ROBOTIC ASSISTED LAPAROSCOPIC lysis of adhesions (extensive) including enterolysis and drainage of peritoneal inclusion cyst x2;  Surgeon: Elenora Fenderhelsea C Ward, MD;  Location: [facility] ORS;  Service: Gynecology;  Laterality: Left;  . SMALL INTESTINE SURGERY    . TONSILLECTOMY    . TONSILLECTOMY     AGE 42 OR 8    Family History  Problem Relation Age of Onset  .  Diabetes Father   . Hyperlipidemia Father   . Hypertension Father     Social History   Tobacco Use  . Smoking status: Former Games developermoker  . Smokeless tobacco: Never Used  Substance Use Topics  . Alcohol use: No  . Drug use: Not Currently    Types: Marijuana    Patient Active Problem List   Diagnosis Date Noted  . Tubo-ovarian abscess 03/14/2015  . Suicidal ideations 01/16/2014  . Deliberate self-cutting 01/16/2014  . Cluster B personality disorder (HCC) 01/16/2014  . PMDD (premenstrual dysphoric disorder) 01/16/2014  . MDD (major depressive disorder), single episode, severe (HCC) 01/15/2014    Home Medications:    Current Meds  Medication Sig  . albuterol (VENTOLIN HFA) 108 (90 Base) MCG/ACT inhaler Inhale 2 puffs into the lungs every 4 (four) hours as needed for wheezing.  Marland Kitchen. ibuprofen (ADVIL,MOTRIN) 800 MG tablet Take 1 tablet (800 mg total) by mouth 3 (three) times daily.    Allergies:   Tylenol [acetaminophen] and Penicillins  Review of Systems (ROS): Review of Systems  Constitutional: Negative for chills and fever.  Skin: Positive for wound (sutures x 3 in place).     Vital Signs: Today's Vitals   06/11/19 1418 06/11/19 1419 06/11/19 1450  BP:  106/76   Pulse:  75   Resp:  18  Temp:  98.7 F (37.1 C)   SpO2:  100%   Height: 5\' 7"  (1.702 m)    PainSc: 0-No pain  0-No pain    Physical Exam: Physical Exam  Constitutional: She is well-developed, well-nourished, and in no distress. No distress.  HENT:  Head: Normocephalic and atraumatic.  Eyes: Pupils are equal, round, and reactive to light.  Cardiovascular: Normal rate and intact distal pulses.  Pulmonary/Chest: Effort normal. No respiratory distress.  Skin: Laceration (sutures laceration to volar aspect of RIGHT wrist) noted. She is not diaphoretic.  No signs of infection; sutures intact. Wound well approximated.     Urgent Care Treatments / Results:   LABS: PLEASE NOTE: all labs that were ordered  this encounter are listed, however only abnormal results are displayed. Labs Reviewed - No data to display  EKG: -None  RADIOLOGY: No results found.  PROCEDURES: Wound Care Performed by: Karen Kitchens, NP Authorized by: Karen Kitchens, NP   Consent:    Consent obtained:  Verbal   Consent given by:  Patient   Risks discussed:  Bleeding, infection, pain and poor cosmetic result Anesthesia (see MAR for exact dosages):    Anesthesia method:  None Procedure details:    Wound age (days):  >14 (injury occurred 05/25/2019) Skin layer closed with:    Wound care performed:  Sutures removed (x 3) Dressing:    Dressing: TAO and bandaid. Post-procedure details:    Patient tolerance of procedure:  Tolerated well, no immediate complications Comments:     Wound well approximated following removal. No signs of infection. No drainage.    MEDICATIONS RECEIVED THIS VISIT: Medications - No data to display  PERTINENT CLINICAL COURSE NOTES/UPDATES:   Initial Impression / Assessment and Plan / Urgent Care Course:  Pertinent labs & imaging results that were available during my care of the patient were personally reviewed by me and considered in my medical decision making (see lab/imaging section of note for values and interpretations).  Sheryl Dixon is a 18 y.o. female who presents to [facility] today with complaints of Suture / Staple Removal   Patient is well appearing overall in clinic today. She does not appear to be in any acute distress. Presenting symptoms (see HPI) and exam as documented above. Sutures x 3 removed per above procedure note. No signs of infection. TAO and dressing applied by clinic nursing staff. Discussed continued antibiotic ointment and dressing for the next couple of days. Monitor for signs and symptoms of infection, which would include increased redness, swelling, streaking, drainage, pain, and the development of a fever.    I have reviewed the follow up and  strict return precautions for any new or worsening symptoms. Patient is aware of symptoms that would be deemed urgent/emergent, and would thus require further evaluation either here or in the emergency department. At the time of discharge, she verbalized understanding and consent with the discharge plan as it was reviewed with her. All questions were fielded by provider and/or clinic staff prior to patient discharge.    Final Clinical Impressions / Urgent Care Diagnoses:   Final diagnoses:  Visit for suture removal    New Prescriptions:  [place] Controlled Substance Registry consulted? Not Applicable  No orders of the defined types were placed in this encounter.   Recommended Follow up Care:  Patient encouraged to follow up with the following provider within the specified time frame, or sooner as dictated by the severity of her symptoms. As always, she was  instructed that for any urgent/emergent care needs, she should seek care either here or in the emergency department for more immediate evaluation.  Follow-up Information    PCP.   Why: As needed        NOTE: This note was prepared using Scientist, clinical (histocompatibility and immunogenetics) along with smaller Lobbyist. Despite my best ability to proofread, there is the potential that transcriptional errors may still occur from this process, and are completely unintentional.     Verlee Monte, NP 06/11/19 1513

## 2019-07-14 ENCOUNTER — Other Ambulatory Visit: Payer: Self-pay

## 2019-07-14 ENCOUNTER — Emergency Department
Admission: EM | Admit: 2019-07-14 | Discharge: 2019-07-14 | Disposition: A | Discharge status: Home / Self Care | Payer: Self-pay | Attending: Emergency Medicine | Admitting: Emergency Medicine

## 2019-07-14 ENCOUNTER — Encounter: Payer: Self-pay | Admitting: Emergency Medicine

## 2019-07-14 DIAGNOSIS — Z87891 Personal history of nicotine dependence: Secondary | ICD-10-CM | POA: Insufficient documentation

## 2019-07-14 DIAGNOSIS — J45909 Unspecified asthma, uncomplicated: Secondary | ICD-10-CM | POA: Insufficient documentation

## 2019-07-14 DIAGNOSIS — N898 Other specified noninflammatory disorders of vagina: Secondary | ICD-10-CM | POA: Insufficient documentation

## 2019-07-14 LAB — URINALYSIS, COMPLETE (UACMP) WITH MICROSCOPIC
Bilirubin Urine: NEGATIVE
Glucose, UA: NEGATIVE mg/dL
Hgb urine dipstick: NEGATIVE
Ketones, ur: NEGATIVE mg/dL
Leukocytes,Ua: NEGATIVE
Nitrite: NEGATIVE
Protein, ur: NEGATIVE mg/dL
Specific Gravity, Urine: 1.003 — ABNORMAL LOW (ref 1.005–1.030)
pH: 6 (ref 5.0–8.0)

## 2019-07-14 LAB — WET PREP, GENITAL
Clue Cells Wet Prep HPF POC: NONE SEEN
Sperm: NONE SEEN
Trich, Wet Prep: NONE SEEN
Yeast Wet Prep HPF POC: NONE SEEN

## 2019-07-14 LAB — POCT PREGNANCY, URINE: Preg Test, Ur: NEGATIVE

## 2019-07-14 NOTE — Discharge Instructions (Addendum)
You will be called on the results of your gonorrhea and Chlamydia test.  Urinalysis is negative as well as your wet prep.  Increase fluids which should help with the cloudiness in your urine.  You may also follow-up with the health department and the contact information and address is listed on your discharge papers.

## 2019-07-14 NOTE — ED Triage Notes (Signed)
Pt to ED via POV c/o intermittent pelvic pain. Pt states that she is also having an increase in discharge and intermittent cloudy urine. Pt is in NAD at this time.

## 2019-07-14 NOTE — ED Notes (Signed)
See triage note  Presents with some dysuria and foul smelling urine  Denies any vagina; discharge

## 2019-07-14 NOTE — ED Provider Notes (Signed)
[facility] Emergency Department Provider Note  ____________________________________________   First MD Initiated Contact with Patient 07/14/19 [phone]     (approximate)  I have reviewed the triage vital signs and the nursing notes.   HISTORY  Chief Complaint Pelvic Pain   HPI Sheryl Dixon is a 18 y.o. female presents to the ED with complaint of dysuria and also vaginal discharge.  Patient states that discharge started several days ago.  She denies any fever, chills, nausea or vomiting.  She states she is not using any birth control or protection.  She rates her discomfort as 5/10.     Past Medical History:  Diagnosis Date  . Allergy   . Anxiety    NO MEDS BUT PT DOES SEE A COUNSELOR PER PTS MOM [name]  . Arthritis   . Asthma    AS A CHILD-NO INHALERS  . Bilateral ovarian cysts   . 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 Hx MRSA infection   . Ovarian cyst   . S/P gastrochisis repair, follow-up exam   . Seizures (HCC)    LAST SEIZURE WHEN PT WAS < 46 YEAR OLD    Patient Active Problem List   Diagnosis Date Noted  . Tubo-ovarian abscess 03/14/2015  . Suicidal ideations 01/16/2014  . Deliberate self-cutting 01/16/2014  . Cluster B personality disorder (Bernice) 01/16/2014  . PMDD (premenstrual dysphoric disorder) 01/16/2014  . MDD (major depressive disorder), single episode, severe (Mitchell) 01/15/2014    Past Surgical History:  Procedure Laterality Date  . ADENOIDECTOMY    . ADENOIDECTOMY     AGE 43 OR 8  . APPENDECTOMY    . GASTROSCHISIS CLOSURE     x 7   . NOSE SURGERY    . ROBOTIC ASSISTED LAPAROSCOPIC OVARIAN CYSTECTOMY Left 03/16/2016   Procedure: ROBOTIC ASSISTED LAPAROSCOPIC lysis of adhesions (extensive) including enterolysis and drainage of peritoneal inclusion cyst x2;  Surgeon: Honor Loh Ward, MD;  Location: [facility] ORS;  Service: Gynecology;  Laterality: Left;  . SMALL INTESTINE SURGERY    . TONSILLECTOMY    . TONSILLECTOMY     AGE 43 OR 8    Prior to Admission medications   Not on File    Allergies Tylenol [acetaminophen] and Penicillins  Family History  Problem Relation Age of Onset  . Diabetes Father   . Hyperlipidemia Father   . Hypertension Father     Social History Social History   Tobacco Use  . Smoking status: Former Research scientist (life sciences)  . Smokeless tobacco: Never Used  Substance Use Topics  . Alcohol use: No  . Drug use: Not Currently    Types: Marijuana    Review of Systems Constitutional: No fever/chills Cardiovascular: Denies chest pain. Respiratory: Denies shortness of breath. Gastrointestinal: No abdominal pain.  No nausea, no vomiting.  No diarrhea.   Genitourinary: Positive for dysuria.  Positive for vaginal discharge. Musculoskeletal: Negative for back pain. Skin: Negative for rash. Neurological: Negative for headaches, focal weakness or numbness. ___________________________________________   PHYSICAL EXAM:  VITAL SIGNS: ED Triage Vitals  Enc Vitals Group     BP 07/14/19 0746 111/72     Pulse Rate 07/14/19 0744 82     Resp 07/14/19 0744 16     Temp 07/14/19 0744 97.6 F (36.4 C)     Temp Source 07/14/19 0744 Oral     SpO2 07/14/19 0744 100 %     Weight 07/14/19 0742 165 lb (74.8 kg)     Height  07/14/19 0742 5\' 7"  (1.702 m)     Head Circumference --      Peak Flow --      Pain Score 07/14/19 0742 5     Pain Loc --      Pain Edu? --      Excl. in GC? --    Constitutional: Alert and oriented. Well appearing and in no acute distress. Eyes: Conjunctivae are normal.  Head: Atraumatic. Neck: No stridor.   Cardiovascular: Normal rate, regular rhythm. Grossly normal heart sounds.  Good peripheral circulation. Respiratory: Normal respiratory effort.  No retractions. Lungs CTAB. Gastrointestinal: Soft and nontender. No distention. Genitourinary: External genitalia is unremarkable.  There is some minimal thin white secretions noted along the vaginal wall.  No irritation is noted of the cervical  os or discharge.  No adnexal masses or tenderness noted.  No cervical motion tenderness. Musculoskeletal: Moves upper and lower extremities with any difficulty.  Normal gait was noted. Neurologic:  Normal speech and language. No gross focal neurologic deficits are appreciated. No gait instability. Skin:  Skin is warm, dry and intact. No rash noted. Psychiatric: Mood and affect are normal. Speech and behavior are normal.  ____________________________________________   LABS (all labs ordered are listed, but only abnormal results are displayed)  Labs Reviewed  WET PREP, GENITAL - Abnormal; Notable for the following components:      Result Value   WBC, Wet Prep HPF POC RARE (*)    All other components within normal limits  URINALYSIS, COMPLETE (UACMP) WITH MICROSCOPIC - Abnormal; Notable for the following components:   Color, Urine STRAW (*)    APPearance CLEAR (*)    Specific Gravity, Urine 1.003 (*)    Bacteria, UA RARE (*)    All other components within normal limits  GC/CHLAMYDIA PROBE AMP  POC URINE PREG, ED  POCT PREGNANCY, URINE    PROCEDURES  Procedure(s) performed (including Critical Care):  Procedures   ____________________________________________   INITIAL IMPRESSION / ASSESSMENT AND PLAN / ED COURSE  As part of my medical decision making, I reviewed the following data within the electronic MEDICAL RECORD NUMBER Notes from prior ED visits and [place] Controlled Substance Database. Sheryl Dixon was evaluated in Emergency Department on 07/14/2019 for the symptoms described in the history of present illness. She was evaluated in the context of the global COVID-19 pandemic, which necessitated consideration that the patient might be at risk for infection with the SARS-CoV-2 virus that causes COVID-19. Institutional protocols and algorithms that pertain to the evaluation of patients at risk for COVID-19 are in a state of rapid change based on information released by regulatory bodies  including the CDC and federal and state organizations. These policies and algorithms were followed during the patient's care in the ED.  18 year old female presents to the ED with complaint of dysuria and vaginal discharge for several days.  Patient states that she does not use any protection and is not on any birth control.  Vaginal exam is unremarkable with the exception of some white exudate and wet prep was negative for trichomonas, yeast, WBCs.  Urinalysis was negative.  Patient was made aware that a gonorrhea and Chlamydia test were done but that this is a send out test and it will result in approximately 2 days.  Patient is aware that she can look on my chart to see the results or call to the ED.  No medication was given at this time. ____________________________________________   FINAL CLINICAL IMPRESSION(S) / ED DIAGNOSES  Final diagnoses:  Vaginal discharge     ED Discharge Orders    None       Note:  This document was prepared using Dragon voice recognition software and may include unintentional dictation errors.    Tommi Rumps, PA-C 07/14/19 1058    Chesley Noon, MD 07/14/19 1623

## 2019-07-17 LAB — GC/CHLAMYDIA PROBE AMP
Chlamydia trachomatis, NAA: NEGATIVE
Neisseria Gonorrhoeae by PCR: NEGATIVE

## 2019-07-18 ENCOUNTER — Ambulatory Visit: Payer: Self-pay

## 2019-07-18 NOTE — Telephone Encounter (Signed)
Pt. Calling back for lab work done in ED visit 07/14/19. ED note specifies pt. Call back to ED for results. Pt. Has left messages. Encouraged pt. That they will call her back.

## 2019-10-15 IMAGING — DX DG HAND COMPLETE 3+V*R*
3 series · 3 of 3 positions shown · non-contrast
Comparison: None.

CLINICAL DATA: Punched glass, evaluate for foreign body

EXAM:
RIGHT HAND - COMPLETE 3+ VIEW

[hand ap]
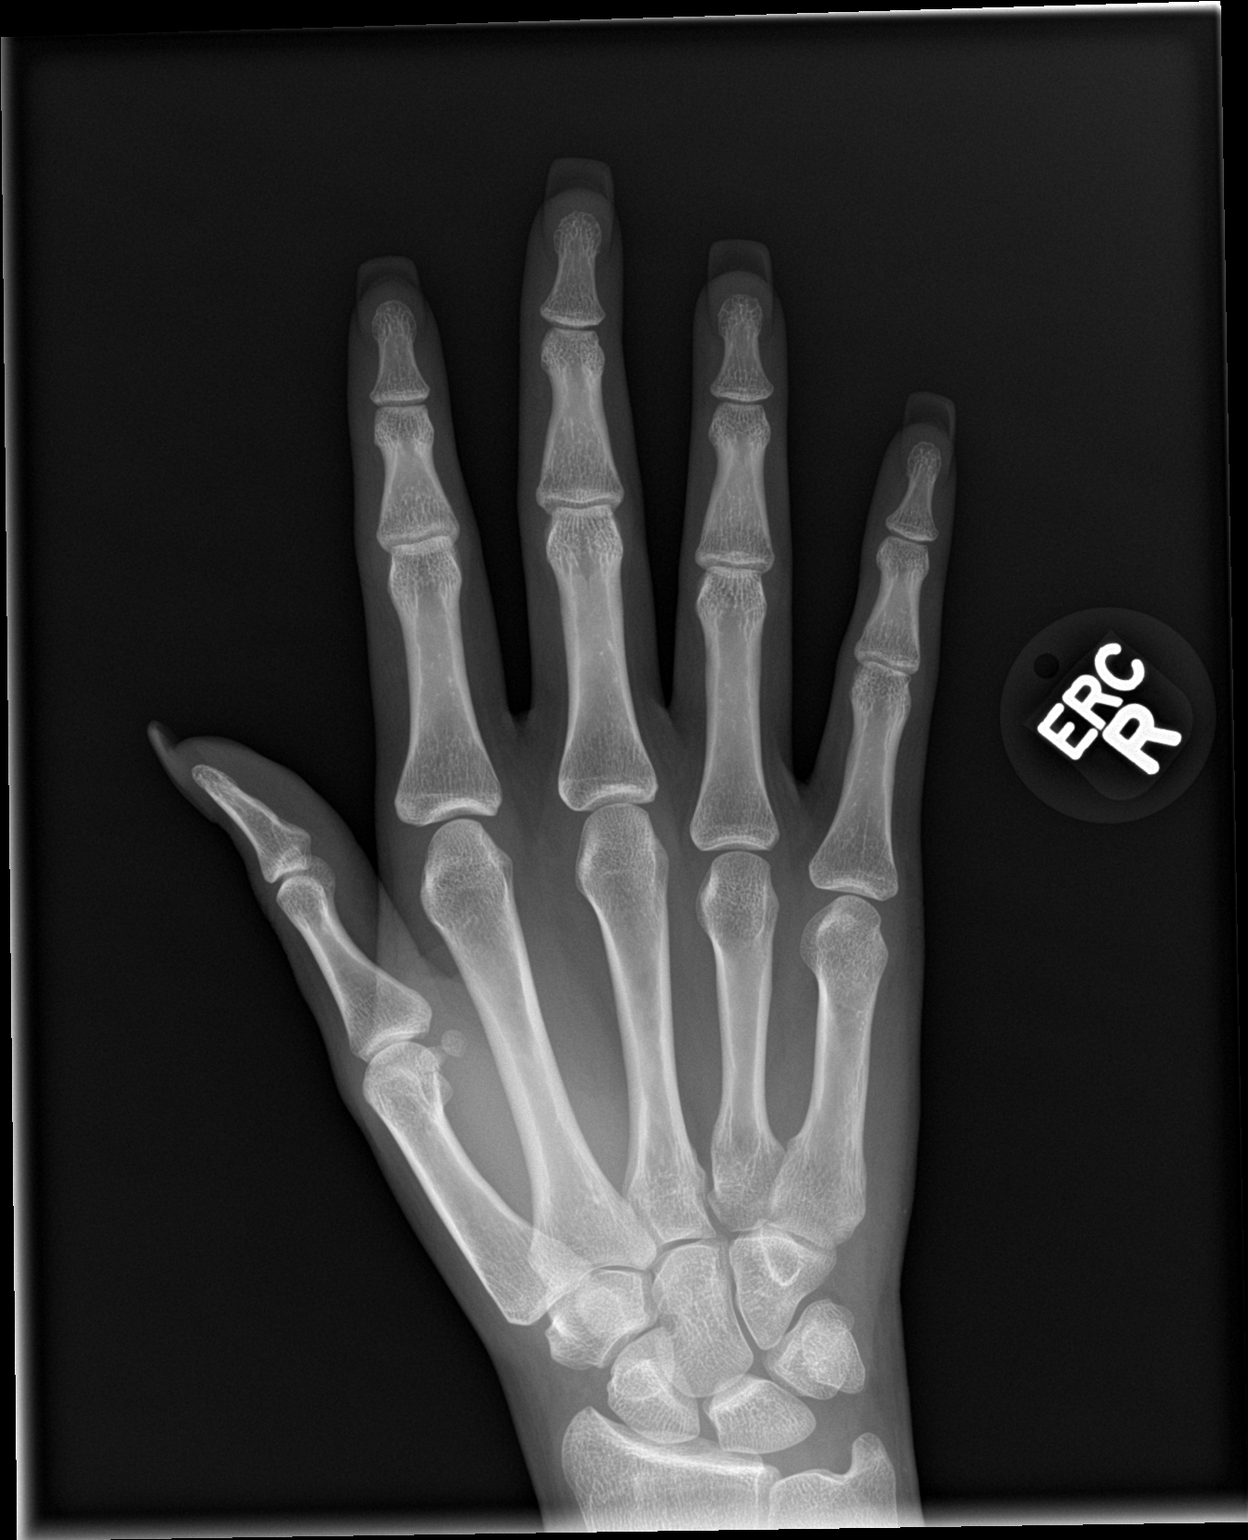

[hand obl]
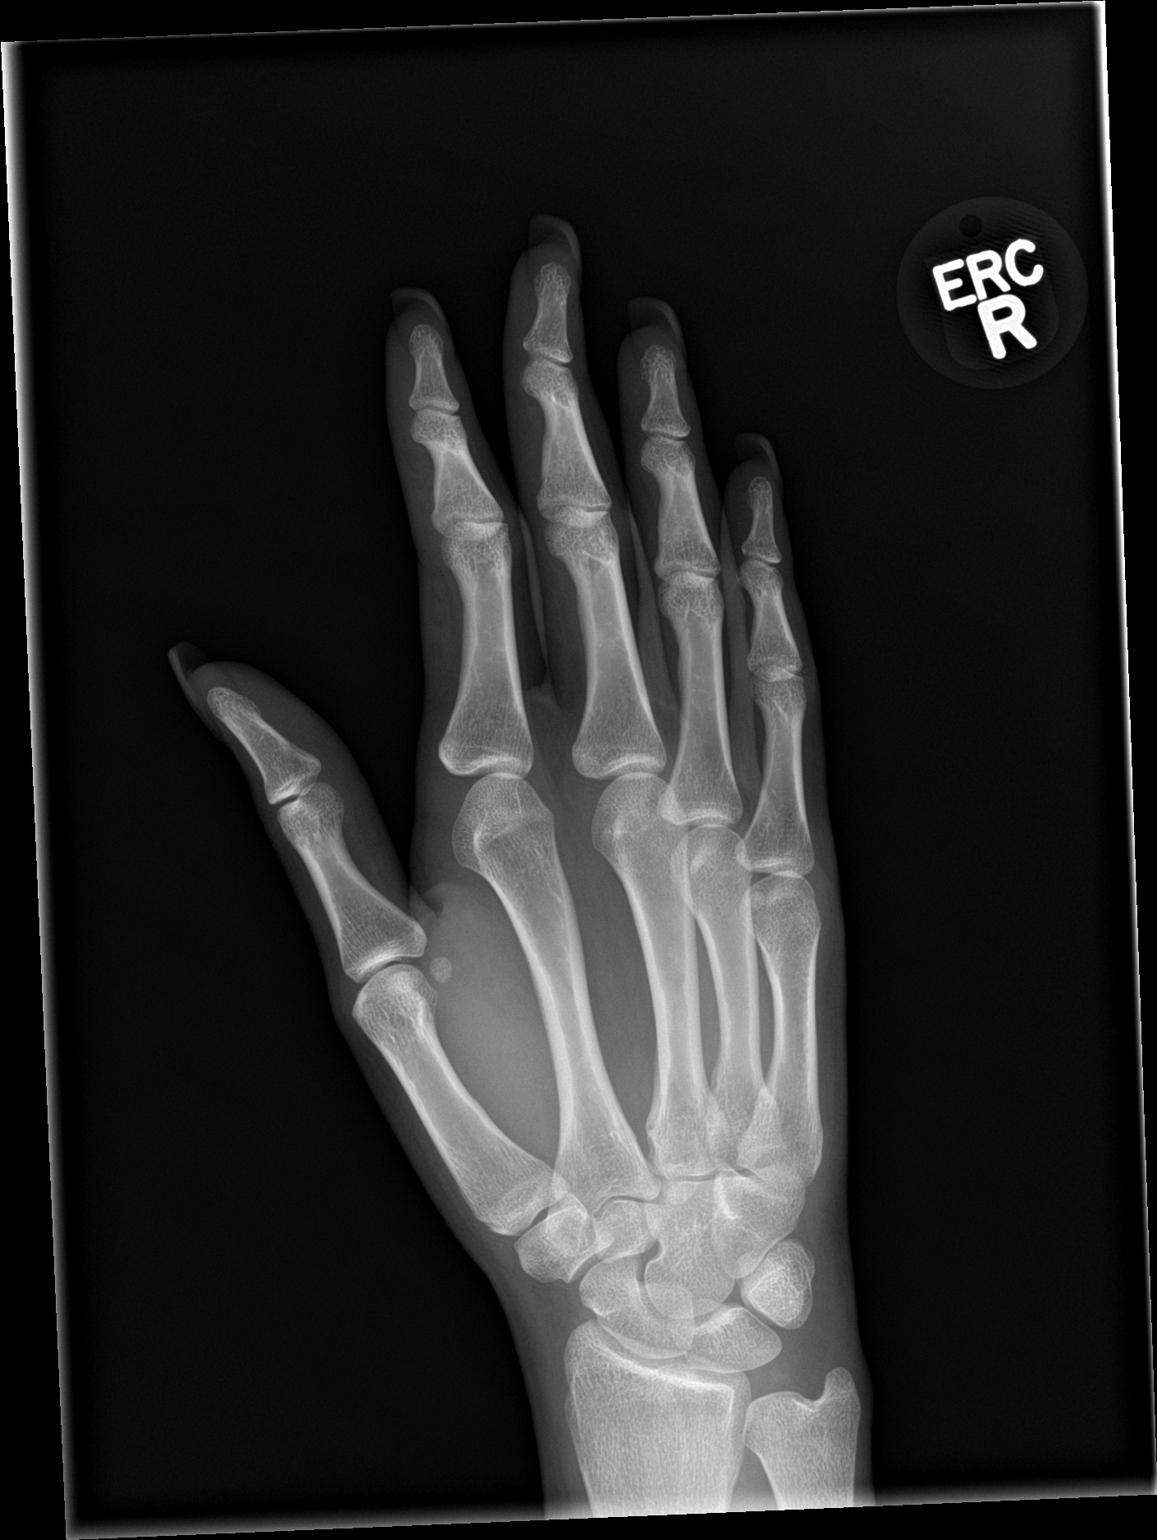

[hand lat]
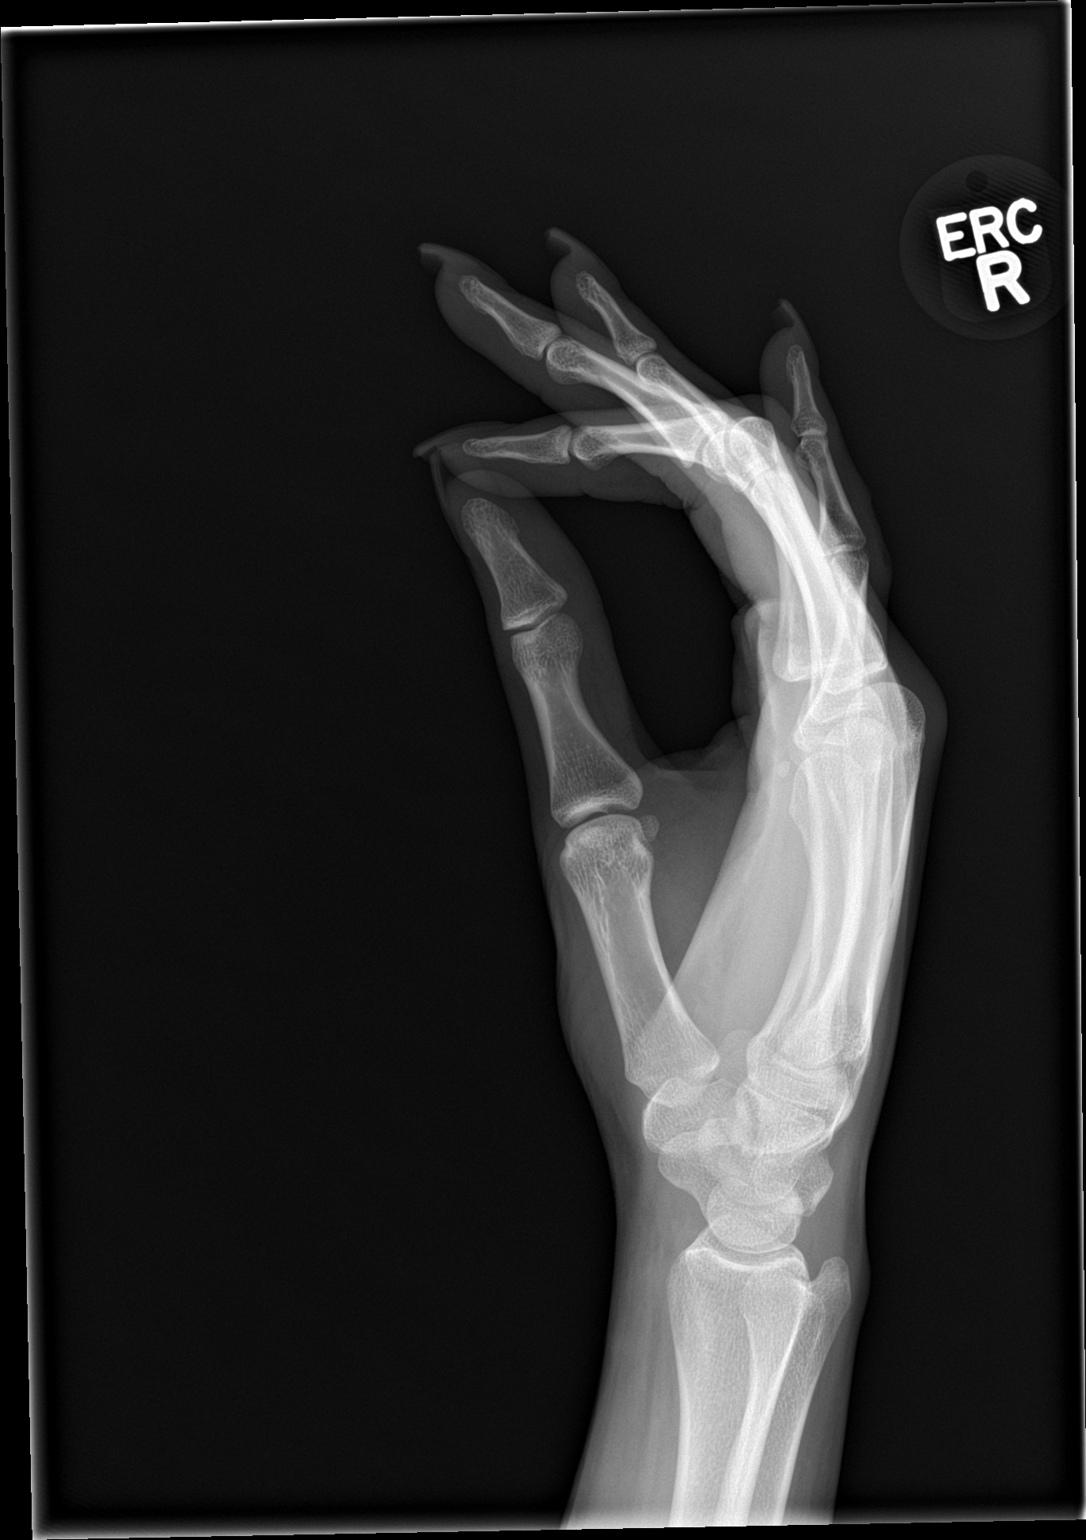

[3 of 3 positions shown; findings below may reference images not displayed]

FINDINGS: There is no evidence of fracture or dislocation. No radiographically
evident laceration. No soft tissue gas or foreign body. Carpal arcs
are maintained. No significant arthrosis.
IMPRESSION: No radiographically evident laceration, foreign body or acute
osseous abnormality.

## 2020-06-19 ENCOUNTER — Other Ambulatory Visit: Payer: Self-pay

## 2020-06-19 ENCOUNTER — Encounter (HOSPITAL_COMMUNITY): Payer: Self-pay | Admitting: Emergency Medicine

## 2020-06-19 ENCOUNTER — Ambulatory Visit (HOSPITAL_COMMUNITY)
Admission: EM | Admit: 2020-06-19 | Discharge: 2020-06-19 | Disposition: A | Discharge status: Home / Self Care | Payer: Self-pay | Attending: Internal Medicine | Admitting: Internal Medicine

## 2020-06-19 DIAGNOSIS — R109 Unspecified abdominal pain: Secondary | ICD-10-CM

## 2020-06-19 DIAGNOSIS — N949 Unspecified condition associated with female genital organs and menstrual cycle: Secondary | ICD-10-CM | POA: Insufficient documentation

## 2020-06-19 DIAGNOSIS — Z3202 Encounter for pregnancy test, result negative: Secondary | ICD-10-CM

## 2020-06-19 DIAGNOSIS — N3000 Acute cystitis without hematuria: Secondary | ICD-10-CM

## 2020-06-19 DIAGNOSIS — J029 Acute pharyngitis, unspecified: Secondary | ICD-10-CM

## 2020-06-19 DIAGNOSIS — N9489 Other specified conditions associated with female genital organs and menstrual cycle: Secondary | ICD-10-CM

## 2020-06-19 DIAGNOSIS — Z113 Encounter for screening for infections with a predominantly sexual mode of transmission: Secondary | ICD-10-CM

## 2020-06-19 DIAGNOSIS — R1030 Lower abdominal pain, unspecified: Secondary | ICD-10-CM

## 2020-06-19 LAB — CBC WITH DIFFERENTIAL/PLATELET
Abs Immature Granulocytes: 0.03 10*3/uL (ref 0.00–0.07)
Basophils Absolute: 0 10*3/uL (ref 0.0–0.1)
Basophils Relative: 0 %
Eosinophils Absolute: 0.1 10*3/uL (ref 0.0–0.5)
Eosinophils Relative: 2 %
HCT: 39.2 % (ref 36.0–46.0)
Hemoglobin: 12.8 g/dL (ref 12.0–15.0)
Immature Granulocytes: 0 %
Lymphocytes Relative: 16 %
Lymphs Abs: 1.2 10*3/uL (ref 0.7–4.0)
MCH: 26.2 pg (ref 26.0–34.0)
MCHC: 32.7 g/dL (ref 30.0–36.0)
MCV: 80.2 fL (ref 80.0–100.0)
Monocytes Absolute: 0.5 10*3/uL (ref 0.1–1.0)
Monocytes Relative: 7 %
Neutro Abs: 5.7 10*3/uL (ref 1.7–7.7)
Neutrophils Relative %: 75 %
Platelets: 271 10*3/uL (ref 150–400)
RBC: 4.89 MIL/uL (ref 3.87–5.11)
RDW: 13.2 % (ref 11.5–15.5)
WBC: 7.7 10*3/uL (ref 4.0–10.5)
nRBC: 0 % (ref 0.0–0.2)

## 2020-06-19 LAB — POCT URINALYSIS DIPSTICK, ED / UC
Bilirubin Urine: NEGATIVE
Glucose, UA: NEGATIVE mg/dL
Hgb urine dipstick: NEGATIVE
Ketones, ur: NEGATIVE mg/dL
Nitrite: POSITIVE — AB
Protein, ur: NEGATIVE mg/dL
Specific Gravity, Urine: 1.02 (ref 1.005–1.030)
Urobilinogen, UA: 0.2 mg/dL (ref 0.0–1.0)
pH: 7 (ref 5.0–8.0)

## 2020-06-19 LAB — POC URINE PREG, ED: Preg Test, Ur: NEGATIVE

## 2020-06-19 LAB — HIV ANTIBODY (ROUTINE TESTING W REFLEX): HIV Screen 4th Generation wRfx: NONREACTIVE

## 2020-06-19 MED ORDER — NITROFURANTOIN MONOHYD MACRO 100 MG PO CAPS: 100.0000 mg | ORAL_CAPSULE | Freq: Two times a day (BID) | ORAL | 0 refills | Status: DC

## 2020-06-19 MED ORDER — FLUCONAZOLE 200 MG PO TABS: 200.0000 mg | ORAL_TABLET | Freq: Once | ORAL | 0 refills | Status: AC

## 2020-06-19 NOTE — ED Triage Notes (Signed)
Pt presents with sore throat xs 2 days. And Abdominal pain xs 1 week.

## 2020-06-19 NOTE — Discharge Instructions (Addendum)
I have sent in Macrobid for you to take twice a day for 5 days  I have sent in fluconazole for you to take. Take one tablet in about 2 days, if symptoms are still present in 3 days, take the second tablet.  I have attached information for you to get established with gynecology.  Blood testing should back by tomorrow.  Swab testing will be about 2 days  Follow up with this office or with primary care as needed

## 2020-06-19 NOTE — ED Provider Notes (Signed)
[facility]   CC: UTI  SUBJECTIVE:  Sheryl Dixon is a 19 y.o. female who complains of urinary frequency, urgency and dysuria for the past week.  Is also concerned for possible STDs.  Requesting screening today.  Patient also requesting CBC to check for hemoglobin as well.  Reports that she has had anemia in the past.  Reports she is also been a bit fatigued. Patient denies a precipitating event, recent sexual encounter, excessive caffeine intake. Localizes the pain to the lower abdomen.  Also reports thick white vaginal discharge with some burning when showering.  Pain is intermittent and describes it as sharp. Has tried OTC medications without relief. Symptoms are made worse with urination. Admits to similar symptoms in the past.  Denies fever, chills, nausea, vomiting, flank pain, bleeding, hematuria.    LMP: Patient's last menstrual period was 05/29/2020.  ROS: As in HPI.  All other pertinent ROS negative.     Past Medical History:  Diagnosis Date  . Allergy   . Anxiety    NO MEDS BUT PT DOES SEE A COUNSELOR PER PTS MOM [name]  . Arthritis   . Asthma    AS A CHILD-NO INHALERS  . Bilateral ovarian cysts   . 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 Hx MRSA infection   . Ovarian cyst   . S/P gastrochisis repair, follow-up exam   . Seizures (HCC)    LAST SEIZURE WHEN PT WAS < 53 YEAR OLD   Past Surgical History:  Procedure Laterality Date  . ADENOIDECTOMY    . ADENOIDECTOMY     AGE 58 OR 8  . APPENDECTOMY    . GASTROSCHISIS CLOSURE     x 7   . NOSE SURGERY    . ROBOTIC ASSISTED LAPAROSCOPIC OVARIAN CYSTECTOMY Left 03/16/2016   Procedure: ROBOTIC ASSISTED LAPAROSCOPIC lysis of adhesions (extensive) including enterolysis and drainage of peritoneal inclusion cyst x2;  Surgeon: Elenora Fender Ward, MD;  Location: [facility] ORS;  Service: Gynecology;  Laterality: Left;  . SMALL INTESTINE SURGERY    . TONSILLECTOMY    . TONSILLECTOMY     AGE 58 OR 8   Allergies  Allergen Reactions  .  Tylenol [Acetaminophen] Other (See Comments)    Body feels numb  . Penicillins Rash    Has patient had a PCN reaction causing immediate rash, facial/tongue/throat swelling, SOB or lightheadedness with hypotension: Unknown Has patient had a PCN reaction causing severe rash involving mucus membranes or skin necrosis: Unknown Has patient had a PCN reaction that required hospitalization: Unknown Has patient had a PCN reaction occurring within the last 10 years: Unknown If all of the above answers are "NO", then may proceed with Cephalosporin use.    No current facility-administered medications on file prior to encounter.   No current outpatient medications on file prior to encounter.   Social History   Socioeconomic History  . Marital status: Single    Spouse name: Not on file  . Number of children: Not on file  . Years of education: Not on file  . Highest education level: Not on file  Occupational History  . Not on file  Tobacco Use  . Smoking status: Former Games developer  . Smokeless tobacco: Never Used  Vaping Use  . Vaping Use: Never used  Substance and Sexual Activity  . Alcohol use: No  . Drug use: Not Currently    Types: Marijuana  . Sexual activity: Yes    Partners: Male  Birth control/protection: None  Other Topics Concern  . Not on file  Social History Narrative   ** Merged History Encounter **       Social Determinants of Health   Financial Resource Strain:   . Difficulty of Paying Living Expenses: Not on file  Food Insecurity:   . Worried About Programme researcher, broadcasting/film/video in the Last Year: Not on file  . Ran Out of Food in the Last Year: Not on file  Transportation Needs:   . Lack of Transportation (Medical): Not on file  . Lack of Transportation (Non-Medical): Not on file  Physical Activity:   . Days of Exercise per Week: Not on file  . Minutes of Exercise per Session: Not on file  Stress:   . Feeling of Stress : Not on file  Social Connections:   . Frequency of  Communication with Friends and Family: Not on file  . Frequency of Social Gatherings with Friends and Family: Not on file  . Attends Religious Services: Not on file  . Active Member of Clubs or Organizations: Not on file  . Attends Banker Meetings: Not on file  . Marital Status: Not on file  Intimate Partner Violence:   . Fear of Current or Ex-Partner: Not on file  . Emotionally Abused: Not on file  . Physically Abused: Not on file  . Sexually Abused: Not on file   Family History  Problem Relation Age of Onset  . Diabetes Father   . Hyperlipidemia Father   . Hypertension Father     OBJECTIVE:  Vitals:   06/19/20 1725  BP: 124/67  Pulse: 98  Resp: 16  Temp: 99.2 F (37.3 C)  TempSrc: Oral  SpO2: 98%   General appearance: AOx3 in no acute distress HEENT: NCAT. Oropharynx clear.  Lungs: clear to auscultation bilaterally without adventitious breath sounds Heart: regular rate and rhythm. Radial pulses 2+ symmetrical bilaterally Abdomen: soft; non-distended; suprapubic tenderness; bowel sounds present; no guarding or rebound tenderness Back: no CVA tenderness GU: deferred Extremities: no edema; symmetrical with no gross deformities Skin: warm and dry Neurologic: Ambulates from chair to exam table without difficulty Psychological: alert and cooperative; normal mood and affect  Labs Reviewed  URINE CULTURE - Abnormal; Notable for the following components:      Result Value   Culture >=100,000 COLONIES/mL ESCHERICHIA COLI (*)    Organism ID, Bacteria ESCHERICHIA COLI (*)    All other components within normal limits  POCT URINALYSIS DIPSTICK, ED / UC - Abnormal; Notable for the following components:   Nitrite POSITIVE (*)    Leukocytes,Ua TRACE (*)    All other components within normal limits  CBC WITH DIFFERENTIAL/PLATELET  HIV ANTIBODY (ROUTINE TESTING W REFLEX)  RPR  POC URINE PREG, ED  CERVICOVAGINAL ANCILLARY ONLY    ASSESSMENT & PLAN:  1. Acute  cystitis without hematuria   2. Lower abdominal pain   3. Vaginal burning   4. Screen for STD (sexually transmitted disease)     Meds ordered this encounter  Medications  . nitrofurantoin, macrocrystal-monohydrate, (MACROBID) 100 MG capsule    Sig: Take 1 capsule (100 mg total) by mouth 2 (two) times daily.    Dispense:  10 capsule    Refill:  0    Order Specific Question:   Supervising Provider    Answer:   Merrilee Jansky [id number]  . fluconazole (DIFLUCAN) 200 MG tablet    Sig: Take 1 tablet (200 mg total) by mouth once  for 1 dose.    Dispense:  2 tablet    Refill:  0    Order Specific Question:   Supervising Provider    Answer:   Merrilee Jansky [id number]   Vaginal swab testing will be back in about 2 days Urine culture sent  We will call you with abnormal results that need further treatment Push fluids and get plenty of rest Prescribed Macrobid We will go ahead and treat for yeast given clinical presentation and symptoms with fluconazole Take antibiotic as directed and to completion Take pyridium as prescribed and as needed for symptomatic relief Follow up with PCP if symptoms persists Return here or go to ER if you have any new or worsening symptoms such as fever, worsening abdominal pain, nausea/vomiting, flank pain  Outlined signs and symptoms indicating need for more acute intervention Patient verbalized understanding After Visit Summary given     Moshe Cipro, NP 06/22/20 [phone]

## 2020-06-20 LAB — RPR: RPR Ser Ql: NONREACTIVE

## 2020-06-21 LAB — URINE CULTURE: Culture: >=100000 — AB

## 2020-06-22 LAB — CERVICOVAGINAL ANCILLARY ONLY
Bacterial Vaginitis (gardnerella): POSITIVE — AB
Candida Glabrata: NEGATIVE
Candida Vaginitis: NEGATIVE
Chlamydia: POSITIVE — AB
Comment: NEGATIVE
Comment: NEGATIVE
Comment: NEGATIVE
Comment: NEGATIVE
Comment: NEGATIVE
Comment: NORMAL
Neisseria Gonorrhea: NEGATIVE
Trichomonas: NEGATIVE

## 2020-06-23 ENCOUNTER — Telehealth (HOSPITAL_COMMUNITY): Payer: Self-pay | Admitting: Emergency Medicine

## 2020-06-23 MED ORDER — METRONIDAZOLE 500 MG PO TABS: 500.0000 mg | ORAL_TABLET | Freq: Two times a day (BID) | ORAL | 0 refills | Status: DC

## 2020-06-23 MED ORDER — AZITHROMYCIN 250 MG PO TABS: 1000.0000 mg | ORAL_TABLET | Freq: Once | ORAL | 0 refills | Status: AC

## 2020-09-22 ENCOUNTER — Encounter: Payer: Self-pay | Admitting: Emergency Medicine

## 2020-09-22 ENCOUNTER — Other Ambulatory Visit: Payer: Self-pay

## 2020-09-22 ENCOUNTER — Ambulatory Visit
Admission: EM | Admit: 2020-09-22 | Discharge: 2020-09-22 | Disposition: A | Discharge status: Home / Self Care | Payer: BC Managed Care – PPO | Attending: Internal Medicine | Admitting: Internal Medicine

## 2020-09-22 DIAGNOSIS — N898 Other specified noninflammatory disorders of vagina: Secondary | ICD-10-CM | POA: Diagnosis not present

## 2020-09-22 DIAGNOSIS — R519 Headache, unspecified: Secondary | ICD-10-CM | POA: Insufficient documentation

## 2020-09-22 NOTE — Discharge Instructions (Addendum)
Your covid test is pending.  Vaginal swab test is pending

## 2020-09-22 NOTE — ED Triage Notes (Signed)
Pt said she has a small amount of discharge. No pain.wants to be tested for Covid also

## 2020-09-22 NOTE — ED Provider Notes (Signed)
[facility]    CSN: [id number] Arrival date & time: 09/22/20  1323      History   Chief Complaint Chief Complaint  Patient presents with  . SEXUALLY TRANSMITTED DISEASE  . Headache  . Diarrhea    HPI Sheryl Dixon is a 20 y.o. female.   Pt request covid test.  Pt reports she was exposed by a coworker.  Pt also request std check.  Pt reports white vaginal discharge.   The history is provided by the patient. No language interpreter was used.  Headache Pain location:  Generalized Radiates to:  Does not radiate Timing:  Constant Progression:  Worsening Relieved by:  Nothing Worsened by:  Nothing Associated symptoms: diarrhea   Diarrhea Associated symptoms: headaches     Past Medical History:  Diagnosis Date  . Allergy   . Anxiety    NO MEDS BUT PT DOES SEE A COUNSELOR PER PTS MOM [name]  . Arthritis   . Asthma    AS A CHILD-NO INHALERS  . Bilateral ovarian cysts   . 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 Hx MRSA infection   . Ovarian cyst   . S/P gastrochisis repair, follow-up exam   . Seizures (HCC)    LAST SEIZURE WHEN PT WAS < 89 YEAR OLD    Patient Active Problem List   Diagnosis Date Noted  . Tubo-ovarian abscess 03/14/2015  . Suicidal ideations 01/16/2014  . Deliberate self-cutting 01/16/2014  . Cluster B personality disorder (HCC) 01/16/2014  . PMDD (premenstrual dysphoric disorder) 01/16/2014  . MDD (major depressive disorder), single episode, severe (HCC) 01/15/2014    Past Surgical History:  Procedure Laterality Date  . ADENOIDECTOMY    . ADENOIDECTOMY     AGE 50 OR 8  . APPENDECTOMY    . GASTROSCHISIS CLOSURE     x 7   . NOSE SURGERY    . ROBOTIC ASSISTED LAPAROSCOPIC OVARIAN CYSTECTOMY Left 03/16/2016   Procedure: ROBOTIC ASSISTED LAPAROSCOPIC lysis of adhesions (extensive) including enterolysis and drainage of peritoneal inclusion cyst x2;  Surgeon: Elenora Fender Ward, MD;  Location: [facility];  Service: Gynecology;  Laterality: Left;   . SMALL INTESTINE SURGERY    . TONSILLECTOMY    . TONSILLECTOMY     AGE 50 OR 8    OB History    Gravida  0   Para  0   Term  0   Preterm  0   AB  0   Living        SAB  0   IAB  0   Ectopic  0   Multiple      Live Births               Home Medications    Prior to Admission medications   Medication Sig Start Date End Date Taking? Authorizing Provider  metroNIDAZOLE (FLAGYL) 500 MG tablet Take 1 tablet (500 mg total) by mouth 2 (two) times daily. 06/23/20   Lamptey, Britta Mccreedy, MD  nitrofurantoin, macrocrystal-monohydrate, (MACROBID) 100 MG capsule Take 1 capsule (100 mg total) by mouth 2 (two) times daily. 06/19/20   Moshe Cipro, NP    Family History Family History  Problem Relation Age of Onset  . Diabetes Father   . Hyperlipidemia Father   . Hypertension Father     Social History Social History   Tobacco Use  . Smoking status: Former Games developer  . Smokeless tobacco: Never Used  Vaping Use  . Vaping  Use: Never used  Substance Use Topics  . Alcohol use: No  . Drug use: Not Currently    Types: Marijuana     Allergies   Tylenol [acetaminophen] and Penicillins   Review of Systems Review of Systems  Gastrointestinal: Positive for diarrhea.  Neurological: Positive for headaches.  All other systems reviewed and are negative.    Physical Exam Triage Vital Signs ED Triage Vitals  Enc Vitals Group     BP 09/22/20 1358 103/81     Pulse Rate 09/22/20 1358 95     Resp 09/22/20 1358 16     Temp 09/22/20 1358 98.1 F (36.7 C)     Temp Source 09/22/20 1358 Oral     SpO2 09/22/20 1358 99 %     Weight --      Height --      Head Circumference --      Peak Flow --      Pain Score 09/22/20 1359 0     Pain Loc --      Pain Edu? --      Excl. in GC? --    No data found.  Updated Vital Signs BP 103/81 (BP Location: Right Arm)   Pulse 95   Temp 98.1 F (36.7 C) (Oral)   Resp 16   LMP 09/15/2020   SpO2 99%   Visual Acuity Right  Eye Distance:   Left Eye Distance:   Bilateral Distance:    Right Eye Near:   Left Eye Near:    Bilateral Near:     Physical Exam Vitals and nursing note reviewed.  Constitutional:      Appearance: She is well-developed and well-nourished.  HENT:     Head: Normocephalic.  Eyes:     Extraocular Movements: EOM normal.  Cardiovascular:     Rate and Rhythm: Normal rate.  Pulmonary:     Effort: Pulmonary effort is normal.  Abdominal:     General: There is no distension.  Musculoskeletal:        General: Normal range of motion.     Cervical back: Normal range of motion.  Neurological:     Mental Status: She is alert and oriented to person, place, and time.  Psychiatric:        Mood and Affect: Mood and affect and mood normal.      UC Treatments / Results  Labs (all labs ordered are listed, but only abnormal results are displayed) Labs Reviewed  NOVEL CORONAVIRUS, NAA    EKG   Radiology No results found.  Procedures Procedures (including critical care time)  Medications Ordered in UC Medications - No data to display  Initial Impression / Assessment and Plan / UC Course  I have reviewed the triage vital signs and the nursing notes.  Pertinent labs & imaging results that were available during my care of the patient were reviewed by me and considered in my medical decision making (see chart for details).     MDM:  covid and vaginal swab results pending Final Clinical Impressions(s) / UC Diagnoses   Final diagnoses:  Nonintractable headache, unspecified chronicity pattern, unspecified headache type  Vaginal discharge     Discharge Instructions     Your covid test is pending.  Vaginal swab test is pending   ED Prescriptions    None     PDMP not reviewed this encounter.  An After Visit Summary was printed and given to the patient.    Elson Areas, [place] 09/22/20  1551  

## 2020-09-23 LAB — SARS-COV-2, NAA 2 DAY TAT

## 2020-09-23 LAB — NOVEL CORONAVIRUS, NAA: SARS-CoV-2, NAA: NOT DETECTED

## 2020-09-24 LAB — CERVICOVAGINAL ANCILLARY ONLY
Bacterial Vaginitis (gardnerella): NEGATIVE
Candida Glabrata: NEGATIVE
Candida Vaginitis: NEGATIVE
Chlamydia: NEGATIVE
Comment: NEGATIVE
Comment: NEGATIVE
Comment: NEGATIVE
Comment: NEGATIVE
Comment: NEGATIVE
Comment: NORMAL
Neisseria Gonorrhea: NEGATIVE
Trichomonas: NEGATIVE

## 2020-10-28 ENCOUNTER — Encounter: Payer: Self-pay | Admitting: Obstetrics

## 2020-10-28 ENCOUNTER — Other Ambulatory Visit: Payer: Self-pay

## 2020-10-28 ENCOUNTER — Ambulatory Visit (INDEPENDENT_AMBULATORY_CARE_PROVIDER_SITE_OTHER): Payer: BC Managed Care – PPO

## 2020-10-28 ENCOUNTER — Other Ambulatory Visit (HOSPITAL_COMMUNITY)
Admission: RE | Admit: 2020-10-28 | Discharge: 2020-10-28 | Disposition: A | Discharge status: Home / Self Care | Payer: BC Managed Care – PPO | Source: Ambulatory Visit

## 2020-10-28 VITALS — BP 134/80 | HR 91 | Ht 67.0 in | Wt 208.3 lb

## 2020-10-28 DIAGNOSIS — Z8742 Personal history of other diseases of the female genital tract: Secondary | ICD-10-CM | POA: Diagnosis not present

## 2020-10-28 DIAGNOSIS — Z113 Encounter for screening for infections with a predominantly sexual mode of transmission: Secondary | ICD-10-CM | POA: Diagnosis present

## 2020-10-28 DIAGNOSIS — Z124 Encounter for screening for malignant neoplasm of cervix: Secondary | ICD-10-CM

## 2020-10-28 DIAGNOSIS — Z01419 Encounter for gynecological examination (general) (routine) without abnormal findings: Secondary | ICD-10-CM | POA: Diagnosis not present

## 2020-10-28 NOTE — Progress Notes (Signed)
New Pt in office for annual Pt reports that she was going to an OBGYN in [place] that diagnosed her with stage 1 endometriosis.  Pt reports that cycles can be painful and heavy at times, sometimes she has irregular cycles 2x month. LMP 10-17-20. Pt declines BC because she is in same sex relationship. Reports a lot of vaginal discharge.

## 2020-10-28 NOTE — Progress Notes (Signed)
GYNECOLOGY OFFICE VISIT NOTE-WELL WOMAN EXAM  History:   Sheryl Dixon [id number] here today for annual exam.  She reports pain in her "ovary" that is of concern.  She states she has a history of endometriosis and ovarian cysts.  She feels she may have a cyst erupting. She reports she goes to the hospital for pain management during these times. She states she has tried Advil and Ibuprofen with no relief. She reports this occurs "every now and then" or up to "twice a month." She reports the pain is "always on the left side" and is in conjunction with vaginal discharge.  Patient reports that she feels her discharge is "abnormal" because it is too much.  However, she denies vaginal itching or discharge.   Birth Control: None  Reproductive Concerns: Sexual Active: Yes, [facility] in last year: One STD Testing: Desires Breast Exams: Yes, but not often.  She endorses breast awareness. Patient denies family history of breast, uterine, cervical, or ovarian cancer  Medical and Nutrition: PCP: In Process Exercise: None Tobacco/Drugs/Alcohol: "Not Really." I may have some wine and every no and then. Nutrition: "Yea I feel like for the most part" it is balanced.  Patient reports stomach sensitivity. "Starch and salt is my problem."   Social: Safety at home: Endorses DV/A: Denies Social Support: Endorses Employment: Psychologist, educational  Past Medical History:  Diagnosis Date  . Allergy   . Anxiety    NO MEDS BUT PT DOES SEE A COUNSELOR PER PTS MOM [name]  . Arthritis   . Asthma    AS A CHILD-NO INHALERS  . Bilateral ovarian cysts   . 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 Hx MRSA infection   . Ovarian cyst   . S/P gastrochisis repair, follow-up exam   . Seizures (HCC)    LAST SEIZURE WHEN PT WAS < 81 YEAR OLD    Past Surgical History:  Procedure Laterality Date  . ADENOIDECTOMY    . ADENOIDECTOMY     AGE 52 OR 8  . APPENDECTOMY    . GASTROSCHISIS CLOSURE     x 7   . NOSE  SURGERY    . ROBOTIC ASSISTED LAPAROSCOPIC OVARIAN CYSTECTOMY Left 03/16/2016   Procedure: ROBOTIC ASSISTED LAPAROSCOPIC lysis of adhesions (extensive) including enterolysis and drainage of peritoneal inclusion cyst x2;  Surgeon: Elenora Fender Ward, MD;  Location: [facility] ORS;  Service: Gynecology;  Laterality: Left;  . SMALL INTESTINE SURGERY    . TONSILLECTOMY    . TONSILLECTOMY     AGE 52 OR 8    The following portions of the patient's history were reviewed and updated as appropriate: allergies, current medications, past family history, past medical history, past social history, past surgical history and problem list.   Health Maintenance:  No pap history d/t age.  Pap collected today.   Review of Systems:  Pertinent items noted in HPI and remainder of comprehensive ROS otherwise negative.    Objective:    Physical Exam BP 134/80   Pulse 91   Ht 5\' 7"  (1.702 m)   Wt 208 lb 4.8 oz (94.5 kg)   LMP 10/17/2020   BMI 32.62 kg/m  Physical Exam Exam conducted with a chaperone present.  Constitutional:      Appearance: Normal appearance. She is obese.  HENT:     Head: Normocephalic and atraumatic.  Eyes:     Conjunctiva/sclera: Conjunctivae normal.  Neck:     Thyroid: No thyroid mass,  thyromegaly or thyroid tenderness.  Cardiovascular:     Rate and Rhythm: Normal rate and regular rhythm.     Heart sounds: Normal heart sounds.  Pulmonary:     Effort: Pulmonary effort is normal. No respiratory distress.     Breath sounds: Normal breath sounds.  Chest:  Breasts:     Right: Skin change (Keloid type scarring at nipple s/t rejected piercing) present. No mass, nipple discharge or tenderness.     Left: No mass, nipple discharge, skin change or tenderness.      Comments: CBE complete Abdominal:     General: Bowel sounds are normal.     Palpations: Abdomen is soft.     Tenderness: There is no abdominal tenderness.     Comments: Large transverse scar at umbilicus likely from delayed wound  closure.  Genitourinary:    General: Normal vulva.     Labia:        Right: No tenderness or lesion.        Left: No tenderness or lesion.      Vagina: Vaginal discharge present. No bleeding.     Cervix: No cervical motion tenderness, discharge, friability, erythema or cervical bleeding.     Uterus: Not enlarged.      Adnexa: Right adnexa normal.       Right: No mass or tenderness.         Left: Tenderness (Adnexa Space) present.      Comments: NEFG Vault with moderate amt gritty white discharge. No apparent odor. CV collected Pap collected with brush and spatula. Bimanual Exam with right ovary appreciated. However uterine size and left ovary difficult to assess. Musculoskeletal:     Cervical back: Normal range of motion. No rigidity.  Skin:    General: Skin is warm and dry.     Comments: Multiple Tattoos Multiple scars on arms and legs s/t self-harming (cutting) behaviors.   Neurological:     Mental Status: She is alert and oriented to person, place, and time.  Psychiatric:        Mood and Affect: Mood normal.        Behavior: Behavior normal.        Thought Content: Thought content normal.      Labs and Imaging No results found for this or any previous visit (from the past 168 hour(s)). No results found.   Assessment & Plan:  20 year old Well Woman Exam H/O Endometriosis H/O Ovarian Cysts & Hydrosalpinx Vaginal Discharge  1. Encounter for well woman exam with routine gynecological exam -Exam performed and findings discussed. -Encouraged to activate Mychart. -Educated on AHA exercise recommendations of 30 minutes of moderate to vigorous activity at least 5x/week. -Educated and encouraged to initiate monthly SBE with increased breast awareness including examination of breast for skin changes, moles, tenderness, etc.    2. Screening for cervical cancer -Educated on ASCCP guidelines regarding pap smear evaluation and frequency. -Informed of turnover time and  provider/clinic policy on releasing results. -Pap collected  3. Screening examination for STD (sexually transmitted disease) -CV collected. -Will order blood work. -Discussed notification and timing of results.   4. History of endometriosis -Informed that pain may be related to and worsened d/t h/o endometriosis. -Discussed management of symptoms with Toradol dosing when appropriate. -Will send limited script to pharmacy.   5. History of ovarian cyst -Discussed obtaining pelvic US to assess ovaries. -Patient informed that transvaginal US ordered, but unsure if it will be necessary as patient expresses discomfort with  this route of imaging. -Patient informed that provider will notify with results as appropriate.   Routine preventative health maintenance measures emphasized. Please refer to After Visit Summary for other counseling recommendations.   No follow-ups on file.      Cherre Robins, CNM 10/28/2020

## 2020-10-28 NOTE — Patient Instructions (Signed)
Endometriosis  Endometriosis is a condition in which a tissue similar to the endometrium grows in places outside the uterus. The endometrium is a tissue that forms the lining of the uterus. This tissue can grow in the organs that create the eggs (ovaries), the tubes that carry the eggs to the uterus (fallopian tubes), the vagina, and the bowel. This tissue most often grows on the ovaries and inner lining of the pelvic cavity (peritoneum). When the uterus sheds the endometrium every menstrual cycle, there is bleeding wherever these types of tissue are located. This can cause pain because blood is irritating to tissues that are not normally exposed to it. Endometriosis canalso make it harder for a woman to get pregnant. What are the causes? The cause of this condition is not known. What increases the risk? The following factors may make you more likely to develop this condition: Having a family history of endometriosis. Having never given birth. Starting your period at 10 years of age or younger. What are the signs or symptoms? Often, there are no symptoms of this condition. If you do have symptoms, they may: Vary depending on where the abnormal tissue is growing. Occur during your menstrual period (most often) or at the middle of your cycle. Come and go. You may have no symptoms during some months. Stop when you no longer have your monthly periods (menopause). Symptoms may include: Pain in the area between your hip bones (pelvis). Heavier bleeding during periods. Menstrual periods that happen more than once a month. Pain during sex. Pain in the back or abdomen. Painful bowel movements. Not being able to get pregnant. How is this diagnosed? This condition is diagnosed based on your symptoms and a physical exam. You may have tests, such as: Blood tests and urine tests to help rule out other causes. Ultrasound to look for tissues that are not normal. This is often done over your skin. It is  sometimes done through the vagina (transvaginal). X-ray of the lower bowel (barium enema). CT scan. MRI. To confirm the diagnosis, your health care provider may use a device with a small camera to check tissue inside your abdomen (laparoscopy). Abnormal tissue may be removed and checked in a lab (biopsy). How is this treated? There is no cure for this condition. Treatment focuses on controlling your symptoms. The type of treatment also depends on whether you want to become pregnant in the future. This condition may be treated with: Medicines. These may include: Medicines to relieve pain, including NSAIDs such as ibuprofen. Hormone therapy. This uses artificial hormones to slow the growth of the abnormal tissue. This may include hormonal birth control, such as pills. Surgery to remove the abnormal tissue. During surgery: Tissue may be removed using a laparoscope and a laser (laparoscopic laser treatment). The fallopian tubes, uterus, and ovaries may be removed (hysterectomy). This is done in very severe cases. Follow these instructions at home: Get regular exercise. Limit alcohol use. Eat a balanced diet. Avoid caffeine. Take over-the-counter and prescription medicines only as told by your health care provider. Keep all follow-up visits as told by your health care provider. This is important. Where to find more information American College of Obstetricians and Gynecologists: https://www.acog.org/ Office on Women's Health: https://www.womenshealth.gov/ Contact a health care provider if: You are having new pain or trouble controlling pain. You have problems getting pregnant. You have a fever. Get help right away if you have: Severe pain that does not get better with medicine. Severe nausea and vomiting, or   if you cannot eat or drink without vomiting. Pain that affects your abdomen only on the lower, right side. Pain in your abdomen that gets worse. Swelling in your abdomen. Blood in  your stool (feces). Summary Endometriosis is a condition in which a tissue similar to the endometrium grows in places outside the uterus. The endometrium is a tissue that forms the lining of the uterus. The cause of this condition is not known. This condition may be treated with medicines to relieve pain, hormone therapy, or surgery. If you have this condition, get regular exercise, limit alcohol use, and avoid caffeine. Get help right away if you have severe pain that does not get better with medicine, or if you have severe nausea and vomiting or blood in your stool. This information is not intended to replace advice given to you by your health care provider. Make sure you discuss any questions you have with your healthcare provider. Document Revised: 10/16/2019 Document Reviewed: 10/16/2019 Elsevier Patient Education  2021 Elsevier Inc.  

## 2020-10-29 LAB — CYTOLOGY - PAP
Comment: NEGATIVE
Diagnosis: NEGATIVE
High risk HPV: NEGATIVE

## 2020-10-29 LAB — RPR+HBSAG+HCVAB+...
HIV Screen 4th Generation wRfx: NONREACTIVE
Hep C Virus Ab: <0.1 s/co ratio (ref 0.0–0.9)
Hepatitis B Surface Ag: NEGATIVE
RPR Ser Ql: NONREACTIVE

## 2020-10-30 LAB — CERVICOVAGINAL ANCILLARY ONLY
Bacterial Vaginitis (gardnerella): POSITIVE — AB
Candida Glabrata: NEGATIVE
Candida Vaginitis: NEGATIVE
Chlamydia: NEGATIVE
Comment: NEGATIVE
Comment: NEGATIVE
Comment: NEGATIVE
Comment: NEGATIVE
Comment: NEGATIVE
Comment: NORMAL
Neisseria Gonorrhea: NEGATIVE
Trichomonas: NEGATIVE

## 2020-10-30 MED ORDER — METRONIDAZOLE 500 MG PO TABS: 500.0000 mg | ORAL_TABLET | Freq: Two times a day (BID) | ORAL | 0 refills | Status: DC

## 2020-10-30 MED ORDER — KETOROLAC TROMETHAMINE 10 MG PO TABS: 10.0000 mg | ORAL_TABLET | Freq: Four times a day (QID) | ORAL | 0 refills | Status: DC | PRN

## 2020-10-30 NOTE — Addendum Note (Signed)
Addended by: Gerrit Heck L on: 10/30/2020 09:20 PM   Modules accepted: Orders

## 2020-11-11 ENCOUNTER — Other Ambulatory Visit: Payer: BC Managed Care – PPO

## 2020-11-17 ENCOUNTER — Encounter: Payer: Self-pay | Admitting: Nurse Practitioner

## 2020-11-17 ENCOUNTER — Ambulatory Visit (INDEPENDENT_AMBULATORY_CARE_PROVIDER_SITE_OTHER): Payer: BC Managed Care – PPO | Admitting: Nurse Practitioner

## 2020-11-17 ENCOUNTER — Other Ambulatory Visit: Payer: Self-pay

## 2020-11-17 VITALS — BP 110/64 | HR 74 | Temp 98.1°F | Ht 66.5 in | Wt 212.4 lb

## 2020-11-17 DIAGNOSIS — F5105 Insomnia due to other mental disorder: Secondary | ICD-10-CM

## 2020-11-17 DIAGNOSIS — F329 Major depressive disorder, single episode, unspecified: Secondary | ICD-10-CM | POA: Diagnosis not present

## 2020-11-17 DIAGNOSIS — Z0001 Encounter for general adult medical examination with abnormal findings: Secondary | ICD-10-CM | POA: Diagnosis not present

## 2020-11-17 DIAGNOSIS — F32A Depression, unspecified: Secondary | ICD-10-CM

## 2020-11-17 DIAGNOSIS — F99 Mental disorder, not otherwise specified: Secondary | ICD-10-CM

## 2020-11-17 DIAGNOSIS — Z8669 Personal history of other diseases of the nervous system and sense organs: Secondary | ICD-10-CM | POA: Insufficient documentation

## 2020-11-17 DIAGNOSIS — N83209 Unspecified ovarian cyst, unspecified side: Secondary | ICD-10-CM | POA: Insufficient documentation

## 2020-11-17 DIAGNOSIS — N809 Endometriosis, unspecified: Secondary | ICD-10-CM | POA: Insufficient documentation

## 2020-11-17 LAB — CBC WITH DIFFERENTIAL/PLATELET
Basophils Absolute: 0 10*3/uL (ref 0.0–0.1)
Basophils Relative: 0.4 % (ref 0.0–3.0)
Eosinophils Absolute: 0.1 10*3/uL (ref 0.0–0.7)
Eosinophils Relative: 2 % (ref 0.0–5.0)
HCT: 37.3 % (ref 36.0–46.0)
Hemoglobin: 12.5 g/dL (ref 12.0–15.0)
Lymphocytes Relative: 16 % (ref 12.0–46.0)
Lymphs Abs: 1.2 10*3/uL (ref 0.7–4.0)
MCHC: 33.5 g/dL (ref 30.0–36.0)
MCV: 79.5 fl (ref 78.0–100.0)
Monocytes Absolute: 0.6 10*3/uL (ref 0.1–1.0)
Monocytes Relative: 7.7 % (ref 3.0–12.0)
Neutro Abs: 5.4 10*3/uL (ref 1.4–7.7)
Neutrophils Relative %: 73.9 % (ref 43.0–77.0)
Platelets: 274 10*3/uL (ref 150.0–400.0)
RBC: 4.7 Mil/uL (ref 3.87–5.11)
RDW: 13.9 % (ref 11.5–15.5)
WBC: 7.2 10*3/uL (ref 4.0–10.5)

## 2020-11-17 LAB — COMPREHENSIVE METABOLIC PANEL
ALT: 12 U/L (ref 0–35)
AST: 13 U/L (ref 0–37)
Albumin: 4 g/dL (ref 3.5–5.2)
Alkaline Phosphatase: 69 U/L (ref 39–117)
BUN: 9 mg/dL (ref 6–23)
CO2: 29 mEq/L (ref 19–32)
Calcium: 9.6 mg/dL (ref 8.4–10.5)
Chloride: 103 mEq/L (ref 96–112)
Creatinine, Ser: 0.51 mg/dL (ref 0.40–1.20)
GFR: 133.54 mL/min (ref >60.00)
Glucose, Bld: 82 mg/dL (ref 70–99)
Potassium: 4 mEq/L (ref 3.5–5.1)
Sodium: 138 mEq/L (ref 135–145)
Total Bilirubin: 0.6 mg/dL (ref 0.2–1.2)
Total Protein: 7.1 g/dL (ref 6.0–8.3)

## 2020-11-17 LAB — TSH: TSH: 2.56 u[IU]/mL (ref 0.35–4.50)

## 2020-11-17 MED ORDER — TRAZODONE HCL 50 MG PO TABS: 25.0000 mg | ORAL_TABLET | Freq: Every day | ORAL | 5 refills | Status: DC

## 2020-11-17 NOTE — Progress Notes (Addendum)
Subjective:    Patient ID: Sheryl Dixon, female    DOB: 07/20/2001, 20 y.o.   MRN: [id number]  Patient presents today for CPE and eval of chronic condition  HPI Depressive disorder Onset in middle school, waxing and waning. Worsening in last 31months No SI/HI/ psychosis No caffeine/ETOH/illicit drug use/nicotine products. Ongoing appts with therapist but she has relocated. She needs a new referral. Previous use of floxetine, d/c due to daytime somnolence and fatigue.  Start trazodone Entered referral to psychology F/up in 2weeks  Sexual History (orientation,birth control, marital status, STD):breast and pelvic exam deferred to Gyn per patient  Depression/Suicide: onset in middle school, wants medication and psychology referral Works at [employer], 12hrs shift: 11am-11pm, insomnia (no improvement with melatonin and unisom) Racing thoughts Depression screen [facility] 2/9 11/17/2020  Decreased Interest 1  Down, Depressed, Hopeless 2  PHQ - 2 Score 3  Altered sleeping 3  Tired, decreased energy 2  Change in appetite 3  Feeling bad or failure about yourself  2  Trouble concentrating 0  Moving slowly or fidgety/restless 1  Suicidal thoughts 0  PHQ-9 Score 14  Difficult doing work/chores Somewhat difficult   GAD 7 : Generalized Anxiety Score 11/17/2020  Nervous, Anxious, on Edge 3  Control/stop worrying 3  Worry too much - different things 3  Trouble relaxing 3  Restless 1  Easily annoyed or irritable 3  Afraid - awful might happen 3  Total GAD 7 Score 19  Anxiety Difficulty Somewhat difficult   Vision:will schedule  Dental:will schedule  Immunizations: (TDAP, Hep C screen, Pneumovax, Influenza, zoster)  Health Maintenance  Topic Date Due  . COVID-19 Vaccine (1) Never done  . HPV Vaccine (2 - 2-dose series) 01/31/2012  . Flu Shot  04/12/2020  . Chlamydia screening  10/28/2021  . Pap Smear  10/29/2023  . Pap Smear  10/29/2023  . Tetanus Vaccine  05/24/2029  .  Hepatitis C:  One time screening is recommended by Center for Disease Control  (CDC) for  adults born from [birth year] through 1967.   Completed  . HIV Screening  Completed   Diet:regular. Weight:  Wt Readings from Last 3 Encounters:  11/17/20 212 lb 6.4 oz (96.3 kg)  10/28/20 208 lb 4.8 oz (94.5 kg)  07/14/19 165 lb (74.8 kg) (90 %, Z= 1.26)*   * Growth percentiles are based on CDC (Girls, 2-20 Years) data.    Fall Risk: Fall Risk  11/17/2020  Falls in the past year? 0  Number falls in past yr: 0  Injury with Fall? 0  Risk for fall due to : No Fall Risks  Follow up Falls evaluation completed   Medications and allergies reviewed with patient and updated if appropriate.  Patient Active Problem List   Diagnosis Date Noted  . 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 Endometriosis 11/17/2020  . History of seizures as a child 11/17/2020  . Ovarian cyst 11/17/2020  . History of gastroschisis 11/22/2017  . Pelvic adhesive disease 11/22/2017  . Pelvic pain in female 11/18/2017  . Tubo-ovarian abscess 03/14/2015  . Suicidal ideations 01/16/2014  . Deliberate self-cutting 01/16/2014  . Cluster B personality disorder (HCC) 01/16/2014  . PMDD (premenstrual dysphoric disorder) 01/16/2014  . Depressive disorder 01/15/2014  . Asthma without status asthmaticus 04/20/2010  . Allergic rhinitis 03/29/2010   Current Outpatient Medications on File Prior to Visit  Medication Sig Dispense Refill  . metroNIDAZOLE (FLAGYL) 500 MG tablet Take 1 tablet (500 mg total) by mouth 2 (  two) times daily. 14 tablet 0   No current facility-administered medications on file prior to visit.   Past Medical History:  Diagnosis Date  . Allergy   . Anxiety    NO MEDS BUT PT DOES SEE A COUNSELOR PER PTS MOM [name]  . Arthritis   . Asthma    AS A CHILD-NO INHALERS  . Bilateral ovarian cysts   . 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 Hx MRSA infection   . Ovarian cyst   . S/P gastrochisis repair, follow-up exam   .  Seizures (HCC)    LAST SEIZURE WHEN PT WAS < 84 YEAR OLD    Past Surgical History:  Procedure Laterality Date  . ADENOIDECTOMY    . ADENOIDECTOMY     AGE 41 OR 8  . APPENDECTOMY    . GASTROSCHISIS CLOSURE     x 7   . NOSE SURGERY    . ROBOTIC ASSISTED LAPAROSCOPIC OVARIAN CYSTECTOMY Left 03/16/2016   Procedure: ROBOTIC ASSISTED LAPAROSCOPIC lysis of adhesions (extensive) including enterolysis and drainage of peritoneal inclusion cyst x2;  Surgeon: Elenora Fender Ward, MD;  Location: [facility] ORS;  Service: Gynecology;  Laterality: Left;  . SMALL INTESTINE SURGERY    . TONSILLECTOMY    . TONSILLECTOMY     AGE 41 OR 8    Social History   Socioeconomic History  . Marital status: Single    Spouse name: Not on file  . Number of children: Not on file  . Years of education: Not on file  . Highest education level: Not on file  Occupational History  . Not on file  Tobacco Use  . Smoking status: Former Games developer  . Smokeless tobacco: Never Used  Vaping Use  . Vaping Use: Never used  Substance and Sexual Activity  . Alcohol use: No  . Drug use: Not Currently    Types: Marijuana  . Sexual activity: Yes    Partners: Female    Birth control/protection: None  Other Topics Concern  . Not on file  Social History Narrative   ** Merged History Encounter **       Social Determinants of Health   Financial Resource Strain: Not on file  Food Insecurity: Not on file  Transportation Needs: Not on file  Physical Activity: Not on file  Stress: Not on file  Social Connections: Not on file    Family History  Problem Relation Age of Onset  . Diabetes Father   . Hyperlipidemia Father   . Hypertension Father         Review of Systems  Constitutional: Negative for fever, malaise/fatigue and weight loss.  HENT: Negative for congestion and sore throat.   Eyes:       Negative for visual changes  Respiratory: Negative for cough and shortness of breath.   Cardiovascular: Negative for chest pain,  palpitations and leg swelling.  Gastrointestinal: Negative for blood in stool, constipation, diarrhea and heartburn.  Genitourinary: Negative for dysuria, frequency and urgency.  Musculoskeletal: Negative for falls, joint pain and myalgias.  Skin: Negative for rash.  Neurological: Negative for dizziness, sensory change and headaches.  Endo/Heme/Allergies: Does not bruise/bleed easily.  Psychiatric/Behavioral: Positive for depression. Negative for hallucinations, memory loss, substance abuse and suicidal ideas. The patient is nervous/anxious and has insomnia.     Objective:   Vitals:   11/17/20 1137  BP: 110/64  Pulse: 74  Temp: 98.1 F (36.7 C)  SpO2: 98%    Body mass index  is 33.77 kg/m.   Physical Examination:  Physical Exam Vitals reviewed.  Constitutional:      General: She is not in acute distress.    Appearance: She is well-developed. She is obese.  HENT:     Right Ear: Tympanic membrane, ear canal and external ear normal.     Left Ear: Tympanic membrane, ear canal and external ear normal.     Mouth/Throat:     Mouth: Oropharynx is clear and moist.  Eyes:     Extraocular Movements: Extraocular movements intact and EOM normal.     Conjunctiva/sclera: Conjunctivae normal.  Neck:     Thyroid: No thyroid mass, thyromegaly or thyroid tenderness.  Cardiovascular:     Rate and Rhythm: Normal rate and regular rhythm.     Pulses: Normal pulses.     Heart sounds: Normal heart sounds.  Pulmonary:     Effort: Pulmonary effort is normal. No respiratory distress.     Breath sounds: Normal breath sounds.  Chest:     Chest wall: No tenderness.  Abdominal:     General: Bowel sounds are normal.     Palpations: Abdomen is soft.  Genitourinary:    Comments: Deferred breast and pelvic exam to GYN Musculoskeletal:        General: No edema. Normal range of motion.     Cervical back: Normal range of motion and neck supple.  Lymphadenopathy:     Cervical: No cervical  adenopathy.  Skin:    General: Skin is warm and dry.     Findings: No rash.  Neurological:     Mental Status: She is alert and oriented to person, place, and time.     Deep Tendon Reflexes: Reflexes are normal and symmetric.  Psychiatric:        Mood and Affect: Mood normal.        Thought Content: Thought content normal.    ASSESSMENT and PLAN: This visit occurred during the SARS-CoV-2 public health emergency.  Safety protocols were in place, including screening questions prior to the visit, additional usage of staff PPE, and extensive cleaning of exam room while observing appropriate contact time as indicated for disinfecting solutions.   Alain MarionOsean was seen today for establish care.  Diagnoses and all orders for this visit:  Encounter for preventative adult health care exam with abnormal findings -     CBC with Differential/Platelet -     Comprehensive metabolic panel  Depressive disorder -     traZODone (DESYREL) 50 MG tablet; Take 0.5-1 tablets (25-50 mg total) by mouth at bedtime. -     TSH -     Ambulatory referral to Psychology  Insomnia due to other mental disorder -     traZODone (DESYREL) 50 MG tablet; Take 0.5-1 tablets (25-50 mg total) by mouth at bedtime. -     TSH        Problem List Items Addressed This Visit      Other   Depressive disorder    Onset in middle school, waxing and waning. Worsening in last 3months No SI/HI/ psychosis No caffeine/ETOH/illicit drug use/nicotine products. Ongoing appts with therapist but she has relocated. She needs a new referral. Previous use of floxetine, d/c due to daytime somnolence and fatigue.  Start trazodone Entered referral to psychology F/up in 2weeks      Relevant Medications   traZODone (DESYREL) 50 MG tablet   Other Relevant Orders   TSH (Completed)   Ambulatory referral to Psychology   Insomnia due  to other mental disorder   Relevant Medications   traZODone (DESYREL) 50 MG tablet   Other Relevant Orders    TSH (Completed)    Other Visit Diagnoses    Encounter for preventative adult health care exam with abnormal findings    -  Primary   Relevant Orders   CBC with Differential/Platelet (Completed)   Comprehensive metabolic panel (Completed)      Follow up: Return in about 2 weeks (around 12/01/2020) for Anxiety, depression and insomnia ( ).  Alysia Penna, NP

## 2020-11-17 NOTE — Patient Instructions (Signed)
Go to lab for blood draw  Start trazodone. Take 35mns prior to bedtime. Do not take if you have <8hrs of sleep. You will be contacted to schedule an appt with pschology  Thank you for choosing [facility]  Preventive Care [age]Years Old, Female Preventive care refers to lifestyle choices and visits with your health care provider that can promote health and wellness. At this stage in your life, you may start seeing a primary care physician instead of a pediatrician. It is important to take responsibility for your health and well-being. Preventive care for young adults includes:  A yearly physical exam. This is also called an annual wellness visit.  Regular dental and eye exams.  Immunizations.  Screening for certain conditions.  Healthy lifestyle choices, such as: ? Eating a healthy diet. ? Getting regular exercise. ? Not using drugs or products that contain nicotine and tobacco. ? Limiting alcohol use. What can I expect for my preventive care visit? Physical exam Your health care provider may check your:  Height and weight. These may be used to calculate your BMI (body mass index). BMI is a measurement that tells if you are at a healthy weight.  Heart rate and blood pressure.  Body temperature.  Skin for abnormal spots. Counseling Your health care provider may ask you questions about your:  Past medical problems.  Family's medical history.  Alcohol, tobacco, and drug use.  Home life and relationship well-being.  Access to firearms.  Emotional well-being.  Diet, exercise, and sleep habits.  Sexual activity and sexual health.  Method of birth control.  Menstrual cycle.  Pregnancy history. What immunizations do I need? Vaccines are usually given at various ages, according to a schedule. Your health care provider will recommend vaccines for you based on your age, medical history, and lifestyle or other factors, such as travel or where you work.    What tests do I need? Blood tests  Lipid and cholesterol levels. These may be checked every 5 years starting at age 11346  Hepatitis C test.  Hepatitis B test. Screening  Pelvic exam and Pap test. This may be done every 3 years starting at age 11370  STD (sexually transmitted disease) testing, if you are at risk.  BRCA-related cancer screening. This may be done if you have a family history of breast, ovarian, tubal, or peritoneal cancers. Other tests  Tuberculosis skin test.  Vision and hearing tests.  Skin exam.  Breast exam. Talk with your health care provider about your test results, treatment options, and if necessary, the need for more tests. Follow these instructions at home: Eating and drinking  Eat a healthy diet that includes fresh fruits and vegetables, whole grains, lean protein, and low-fat dairy products.  Drink enough fluid to keep your urine pale yellow.  Do not drink alcohol if: ? Your health care provider tells you not to drink. ? You are pregnant, may be pregnant, or are planning to become pregnant. ? You are under the legal drinking age. In the [place] the legal drinking age is 279  If you drink alcohol: ? Limit how much you use to 0-1 drink a day. ? Be aware of how much alcohol is in your drink. In the [place] one drink equals one 12 oz bottle of beer (355 mL), one 5 oz glass of wine (148 mL), or one 1 oz glass of hard liquor (44 mL).   Lifestyle  Take daily care of your teeth and gums. Brush your teeth  every morning and night with fluoride toothpaste. Floss one time each day.  Stay active. Exercise for at least 30 minutes 5 or more days of the week.  Do not use any products that contain nicotine or tobacco, such as cigarettes, e-cigarettes, and chewing tobacco. If you need help quitting, ask your health care provider.  Do not use drugs.  If you are sexually active, practice safe sex. Use a condom or other form of protection to prevent STIs (sexually  transmitted infections).  If you do not wish to become pregnant, use a form of birth control. If you plan to become pregnant, see your health care provider for a prepregnancy visit.  Find healthy ways to cope with stress, such as: ? Meditation, yoga, or listening to music. ? Journaling. ? Talking to a trusted person. ? Spending time with friends and family. Safety  Always wear your seat belt while driving or riding in a vehicle.  Do not drive: ? If you have been drinking alcohol. Do not ride with someone who has been drinking. ? When you are tired or distracted. ? While texting.  Wear a helmet and other protective equipment during sports activities.  If you have firearms in your house, make sure you follow all gun safety procedures.  Seek help if you have been bullied, physically abused, or sexually abused.  Use the Internet responsibly to avoid dangers, such as online bullying and online sex predators. What's next?  Go to your health care provider once a year for an annual wellness visit.  Ask your health care provider how often you should have your eyes and teeth checked.  Stay up to date on all vaccines. This information is not intended to replace advice given to you by your health care provider. Make sure you discuss any questions you have with your health care provider. Document Revised: 04/26/2020 Document Reviewed: 08/23/2018 Elsevier Patient Education  2021 [employer].

## 2020-11-17 NOTE — Assessment & Plan Note (Addendum)
Onset in middle school, waxing and waning. Worsening in last 23months No SI/HI/ psychosis No caffeine/ETOH/illicit drug use/nicotine products. Ongoing appts with therapist but she has relocated. She needs a new referral. Previous use of floxetine, d/c due to daytime somnolence and fatigue.  Start trazodone Entered referral to psychology F/up in 2weeks

## 2020-11-26 ENCOUNTER — Other Ambulatory Visit: Payer: BC Managed Care – PPO

## 2020-12-03 ENCOUNTER — Ambulatory Visit: Payer: BC Managed Care – PPO | Admitting: Nurse Practitioner

## 2020-12-04 ENCOUNTER — Ambulatory Visit: Payer: BC Managed Care – PPO

## 2020-12-04 ENCOUNTER — Other Ambulatory Visit: Payer: Self-pay

## 2020-12-04 ENCOUNTER — Ambulatory Visit (INDEPENDENT_AMBULATORY_CARE_PROVIDER_SITE_OTHER): Payer: BC Managed Care – PPO | Admitting: Nurse Practitioner

## 2020-12-04 ENCOUNTER — Encounter: Payer: Self-pay | Admitting: Nurse Practitioner

## 2020-12-04 VITALS — BP 116/70 | HR 80 | Temp 97.3°F | Wt 210.8 lb

## 2020-12-04 DIAGNOSIS — F329 Major depressive disorder, single episode, unspecified: Secondary | ICD-10-CM | POA: Diagnosis not present

## 2020-12-04 DIAGNOSIS — F32A Depression, unspecified: Secondary | ICD-10-CM

## 2020-12-04 MED ORDER — DULOXETINE HCL 20 MG PO CPEP: 20.0000 mg | ORAL_CAPSULE | Freq: Every day | ORAL | 5 refills | Status: AC

## 2020-12-04 NOTE — Progress Notes (Signed)
   Subjective:  Patient ID: Sheryl Dixon, female    DOB: 09-Jan-2001  Age: 20 y.o. MRN: [id number]  CC: Follow-up (2 week f/u on anxiety, depression, and insomnia. Pt states she does not feel any difference since starting medication. )  HPI  Depressive disorder Unable to tolerate trazodone (nausea and diarrhea) and no effect on sleep quality or mood. No change in mood  D/c trazodone Start cymbalta 20mg  F/up in 21month  Reviewed past Medical, Social and Family history today.  Outpatient Medications Prior to Visit  Medication Sig Dispense Refill  . traZODone (DESYREL) 50 MG tablet Take 0.5-1 tablets (25-50 mg total) by mouth at bedtime. 30 tablet 5  . metroNIDAZOLE (FLAGYL) 500 MG tablet Take 1 tablet (500 mg total) by mouth 2 (two) times daily. (Patient not taking: Reported on 12/04/2020) 14 tablet 0   No facility-administered medications prior to visit.   ROS See HPI  Objective:  BP 116/70 (BP Location: Left Arm, Patient Position: Sitting, Cuff Size: Large)   Pulse 80   Temp (!) 97.3 F (36.3 C) (Temporal)   Wt 210 lb 12.8 oz (95.6 kg)   SpO2 98%   BMI 33.51 kg/m   Physical Exam Psychiatric:        Attention and Perception: Attention normal.        Mood and Affect: Mood normal.        Speech: Speech normal.        Behavior: Behavior is cooperative.        Thought Content: Thought content normal.        Cognition and Memory: Cognition normal.        Judgment: Judgment normal.    Assessment & Plan:  This visit occurred during the SARS-CoV-2 public health emergency.  Safety protocols were in place, including screening questions prior to the visit, additional usage of staff PPE, and extensive cleaning of exam room while observing appropriate contact time as indicated for disinfecting solutions.   Tenea was seen today for follow-up.  Diagnoses and all orders for this visit:  Depressive disorder -     DULoxetine (CYMBALTA) 20 MG capsule; Take 1 capsule (20 mg total) by  mouth daily. With food   Problem List Items Addressed This Visit      Other   Depressive disorder - Primary    Unable to tolerate trazodone (nausea and diarrhea) and no effect on sleep quality or mood. No change in mood  D/c trazodone Start cymbalta 20mg  F/up in 21month      Relevant Medications   DULoxetine (CYMBALTA) 20 MG capsule      Follow-up: Return in about 4 weeks (around 01/01/2021) for anxiety and depression.  2month, NP

## 2020-12-04 NOTE — Patient Instructions (Addendum)
Schedule appt with therapist Start cymbalta. F/up in 50month  Duloxetine Delayed-Release Capsules What is this medicine? DULOXETINE (doo LOX e teen) is used to treat depression, anxiety, and different types of chronic pain. This medicine may be used for other purposes; ask your health care provider or pharmacist if you have questions. COMMON BRAND NAME(S): Cymbalta, Murrell Converse, Irenka What should I tell my health care provider before I take this medicine? They need to know if you have any of these conditions:  bipolar disorder  glaucoma  high blood pressure  kidney disease  liver disease  seizures  suicidal thoughts, plans or attempt; a previous suicide attempt by you or a family member  take medicines that treat or prevent blood clots  taken medicines called MAOIs like Carbex, Eldepryl, Marplan, Nardil, and Parnate within 14 days  trouble passing urine  an unusual reaction to duloxetine, other medicines, foods, dyes, or preservatives  pregnant or trying to get pregnant  breast-feeding How should I use this medicine? Take this medicine by mouth with a glass of water. Follow the directions on the prescription label. Do not crush, cut or chew some capsules of this medicine. Some capsules may be opened and sprinkled on applesauce. Check with your doctor or pharmacist if you are not sure. You can take this medicine with or without food. Take your medicine at regular intervals. Do not take your medicine more often than directed. Do not stop taking this medicine suddenly except upon the advice of your doctor. Stopping this medicine too quickly may cause serious side effects or your condition may worsen. A special MedGuide will be given to you by the pharmacist with each prescription and refill. Be sure to read this information carefully each time. Talk to your pediatrician regarding the use of this medicine in children. While this drug may be prescribed for children as young as 7 years  of age for selected conditions, precautions do apply. Overdosage: If you think you have taken too much of this medicine contact a poison control center or emergency room at once. NOTE: This medicine is only for you. Do not share this medicine with others. What if I miss a dose? If you miss a dose, take it as soon as you can. If it is almost time for your next dose, take only that dose. Do not take double or extra doses. What may interact with this medicine? Do not take this medicine with any of the following medications:  desvenlafaxine  levomilnacipran  linezolid  MAOIs like Carbex, Eldepryl, Emsam, Marplan, Nardil, and Parnate  methylene blue (injected into a vein)  milnacipran  safinamide  thioridazine  venlafaxine  viloxazine This medicine may also interact with the following medications:  alcohol  amphetamines  aspirin and aspirin-like medicines  certain antibiotics like ciprofloxacin and enoxacin  certain medicines for blood pressure, heart disease, irregular heart beat  certain medicines for depression, anxiety, or psychotic disturbances  certain medicines for migraine headache like almotriptan, eletriptan, frovatriptan, naratriptan, rizatriptan, sumatriptan, zolmitriptan  certain medicines that treat or prevent blood clots like warfarin, enoxaparin, and dalteparin  cimetidine  fentanyl  lithium  NSAIDS, medicines for pain and inflammation, like ibuprofen or naproxen  phentermine  procarbazine  rasagiline  sibutramine  St. John's wort  theophylline  tramadol  tryptophan This list may not describe all possible interactions. Give your health care provider a list of all the medicines, herbs, non-prescription drugs, or dietary supplements you use. Also tell them if you smoke, drink alcohol, or use  illegal drugs. Some items may interact with your medicine. What should I watch for while using this medicine? Tell your doctor if your symptoms do  not get better or if they get worse. Visit your doctor or healthcare provider for regular checks on your progress. Because it may take several weeks to see the full effects of this medicine, it is important to continue your treatment as prescribed by your doctor. This medicine may cause serious skin reactions. They can happen weeks to months after starting the medicine. Contact your healthcare provider right away if you notice fevers or flu-like symptoms with a rash. The rash may be red or purple and then turn into blisters or peeling of the skin. Or, you might notice a red rash with swelling of the face, lips, or lymph nodes in your neck or under your arms. Patients and their families should watch out for new or worsening thoughts of suicide or depression. Also watch out for sudden changes in feelings such as feeling anxious, agitated, panicky, irritable, hostile, aggressive, impulsive, severely restless, overly excited and hyperactive, or not being able to sleep. If this happens, especially at the beginning of treatment or after a change in dose, call your healthcare provider. You may get drowsy or dizzy. Do not drive, use machinery, or do anything that needs mental alertness until you know how this medicine affects you. Do not stand or sit up quickly, especially if you are an older patient. This reduces the risk of dizzy or fainting spells. Alcohol may interfere with the effect of this medicine. Avoid alcoholic drinks. This medicine can cause an increase in blood pressure. This medicine can also cause a sudden drop in your blood pressure, which may make you feel faint and increase the chance of a fall. These effects are most common when you first start the medicine or when the dose is increased, or during use of other medicines that can cause a sudden drop in blood pressure. Check with your doctor for instructions on monitoring your blood pressure while taking this medicine. Your mouth may get dry. Chewing  sugarless gum or sucking hard candy, and drinking plenty of water, may help. Contact your doctor if the problem does not go away or is severe. What side effects may I notice from receiving this medicine? Side effects that you should report to your doctor or health care professional as soon as possible:  allergic reactions like skin rash, itching or hives, swelling of the face, lips, or tongue  anxious  breathing problems  confusion  changes in vision  chest pain  confusion  elevated mood, decreased need for sleep, racing thoughts, impulsive behavior  eye pain  fast, irregular heartbeat  feeling faint or lightheaded, falls  feeling agitated, angry, or irritable  hallucination, loss of contact with reality  high blood pressure  loss of balance or coordination  palpitations  redness, blistering, peeling or loosening of the skin, including inside the mouth  restlessness, pacing, inability to keep still  seizures  stiff muscles  suicidal thoughts or other mood changes  trouble passing urine or change in the amount of urine  trouble sleeping  unusual bleeding or bruising  unusually weak or tired  vomiting  yellowing of the eyes or skin Side effects that usually do not require medical attention (report to your doctor or health care professional if they continue or are bothersome):  change in sex drive or performance  change in appetite or weight  constipation  dizziness  dry  mouth  headache  increased sweating  nausea  tired This list may not describe all possible side effects. Call your doctor for medical advice about side effects. You may report side effects to FDA at 1-800-FDA-1088. Where should I keep my medicine? Keep out of the reach of children and pets. Store at room temperature between 15 and 30 degrees C (59 to 86 degrees F). Get rid of any unused medicine after the expiration date. To get rid of medicines that are no longer needed or  have expired:  Take the medicine to a medicine take-back program. Check with your pharmacy or law enforcement to find a location.  If you cannot return the medicine, check the label or package insert to see if the medicine should be thrown out in the garbage or flushed down the toilet. If you are not sure, ask your health care provider. If it is safe to put it in the trash, take the medicine out of the container. Mix the medicine with cat litter, dirt, coffee grounds, or other unwanted substance. Seal the mixture in a bag or container. Put it in the trash. NOTE: This sheet is a summary. It may not cover all possible information. If you have questions about this medicine, talk to your doctor, pharmacist, or health care provider.  2021 Elsevier/Gold Standard (2020-07-16 16:06:16)

## 2020-12-04 NOTE — Assessment & Plan Note (Signed)
Unable to tolerate trazodone (nausea and diarrhea) and no effect on sleep quality or mood. No change in mood  D/c trazodone Start cymbalta 20mg  F/up in 27month

## 2021-01-08 ENCOUNTER — Ambulatory Visit: Payer: BC Managed Care – PPO | Admitting: Nurse Practitioner

## 2023-03-26 ENCOUNTER — Ambulatory Visit
Admission: EM | Admit: 2023-03-26 | Discharge: 2023-03-26 | Disposition: A | Discharge status: Home / Self Care | Payer: 59 | Attending: Family Medicine | Admitting: Family Medicine

## 2023-03-26 ENCOUNTER — Telehealth: Payer: Self-pay

## 2023-03-26 DIAGNOSIS — N3000 Acute cystitis without hematuria: Secondary | ICD-10-CM | POA: Diagnosis not present

## 2023-03-26 LAB — POCT URINALYSIS DIP (MANUAL ENTRY)
Bilirubin, UA: NEGATIVE
Glucose, UA: NEGATIVE mg/dL
Ketones, POC UA: NEGATIVE mg/dL
Leukocytes, UA: NEGATIVE
Nitrite, UA: NEGATIVE
Protein Ur, POC: NEGATIVE mg/dL
Spec Grav, UA: 1.02 (ref 1.010–1.025)
Urobilinogen, UA: 0.2 E.U./dL
pH, UA: 5.5 (ref 5.0–8.0)

## 2023-03-26 LAB — POCT URINE PREGNANCY: Preg Test, Ur: NEGATIVE

## 2023-03-26 MED ORDER — NITROFURANTOIN MONOHYD MACRO 100 MG PO CAPS: 100.0000 mg | ORAL_CAPSULE | Freq: Two times a day (BID) | ORAL | 0 refills | Status: DC

## 2023-03-26 MED ORDER — NITROFURANTOIN MONOHYD MACRO 100 MG PO CAPS: 100.0000 mg | ORAL_CAPSULE | Freq: Two times a day (BID) | ORAL | 0 refills | Status: AC

## 2023-03-26 NOTE — ED Triage Notes (Signed)
Patient to Urgent Care with complaints of lower back pain and suprapubic pressure. Some urinary urgency. No dysuria.  Symptoms started three weeks ago.

## 2023-03-26 NOTE — Discharge Instructions (Addendum)
Based on symptoms will treat for suspected UTI while your urine culture is pending. Start Macrobid 100 mg twice daily for 5 days. Hydrate well with water.  Our office will contact you if your urine culture results indicate a need for change in treatment.

## 2023-03-26 NOTE — ED Provider Notes (Signed)
Renaldo Fiddler    CSN: [id number] Arrival date & time: 03/26/23  0859      History   Chief Complaint Chief Complaint  Patient presents with   Back Pain    HPI Sheryl Dixon is a 22 y.o. female.   HPI Patient here today with concern for bilateral low back pain and suprapubic pressure.  Patient is having some mild urinary frequency but denies any dysuria. She reports symptoms started approximately 3 weeks ago.  Patient was seen at a different urgent care back in June and had a UA completed which showed no bacteria however the provider subsequently sent out a urine culture and she was called 2 days later to advise that her urine culture was significant for bacterial growth.  She completed a 5-day course of Macrobid.  She reports symptoms resolved and abruptly returned about a week ago.  Patient works as a Museum/gallery conservator and endorses holding her urine often due to an influx of customers calling back to back.  She reports she feels a constant urgency and has noticed an abnormal odor to her urine and cloudy appearance your urine.  Denies any fever, nausea or vomiting.  Endorses lower abdominal pressure and urge to urinate. 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   Endometriosis 11/17/2020   History of seizures as a child 11/17/2020   Ovarian cyst 11/17/2020   History of gastroschisis 11/22/2017   Pelvic adhesive disease 11/22/2017   Pelvic pain in female 11/18/2017   Tubo-ovarian abscess 03/14/2015   Suicidal ideations 01/16/2014    Deliberate self-cutting 01/16/2014   Cluster B personality disorder (HCC) 01/16/2014   PMDD (premenstrual dysphoric disorder) 01/16/2014   Depressive disorder 01/15/2014   Asthma without status asthmaticus 04/20/2010   Allergic rhinitis 03/29/2010    Past Surgical History:  Procedure Laterality Date   ADENOIDECTOMY     ADENOIDECTOMY     AGE 52 OR 8   APPENDECTOMY     GASTROSCHISIS CLOSURE     x 7    NOSE SURGERY     ROBOTIC ASSISTED LAPAROSCOPIC OVARIAN CYSTECTOMY Left 03/16/2016   Procedure: ROBOTIC ASSISTED LAPAROSCOPIC lysis of adhesions (extensive) including enterolysis and drainage of peritoneal inclusion cyst x2;  Surgeon: Elenora Fender Ward, MD;  Location: [facility] ORS;  Service: Gynecology;  Laterality: Left;   SMALL INTESTINE SURGERY     TONSILLECTOMY     TONSILLECTOMY     AGE 52 OR 8    OB History     Gravida  1   Para  0   Term  0   Preterm  0   AB  1   Living         SAB  1   IAB  0   Ectopic  0   Multiple      Live Births  Home Medications    Prior to Admission medications   Medication Sig Start Date End Date Taking? Authorizing Provider  DULoxetine (CYMBALTA) 20 MG capsule Take 1 capsule (20 mg total) by mouth daily. With food Patient not taking: Reported on 03/26/2023 12/04/20   Anne Ng, NP  medroxyPROGESTERone (DEPO-PROVERA) 150 MG/ML injection Inject into the muscle.    [provider]  nitrofurantoin, macrocrystal-monohydrate, (MACROBID) 100 MG capsule Take 1 capsule (100 mg total) by mouth 2 (two) times daily. 03/26/23   Bing Neighbors, NP    Family History Family History  Problem Relation Age of Onset   Diabetes Father    Hyperlipidemia Father    Hypertension Father     Social History Social History   Tobacco Use   Smoking status: Former   Smokeless tobacco: Never  Vaping Use   Vaping status: Never Used  Substance Use Topics   Alcohol use: No   Drug use: Not Currently    Types: Marijuana      Allergies   Tylenol [acetaminophen] and Penicillins   Review of Systems Review of Systems Pertinent negatives listed in HPI  Physical Exam Triage Vital Signs ED Triage Vitals  Encounter Vitals Group     BP 03/26/23 0932 125/84     Systolic BP Percentile --      Diastolic BP Percentile --      Pulse Rate 03/26/23 0932 86     Resp 03/26/23 0932 19     Temp 03/26/23 0932 98.9 F (37.2 C)     Temp src --      SpO2 03/26/23 0932 98 %     Weight --      Height --      Head Circumference --      Peak Flow --      Pain Score 03/26/23 0929 0     Pain Loc --      Pain Education --      Exclude from Growth Chart --    No data found.  Updated Vital Signs BP 125/84   Pulse 86   Temp 98.9 F (37.2 C)   Resp 19   SpO2 98%   Visual Acuity Right Eye Distance:   Left Eye Distance:   Bilateral Distance:    Right Eye Near:   Left Eye Near:    Bilateral Near:     Physical Exam General appearance: Alert, well developed, well nourished, cooperative  Head: Normocephalic, without obvious abnormality, atraumatic Heart: Rate and rhythm normal.   Respiratory: Respirations even and unlabored, normal respiratory rate CVA:  no flank pain Extremities: No gross deformities Skin: Skin color, texture, turgor normal. No rashes seen  Psych: Appropriate mood and affect.  UC Treatments / Results  Labs (all labs ordered are listed, but only abnormal results are displayed) Labs Reviewed  POCT URINALYSIS DIP (MANUAL ENTRY) - Abnormal; Notable for the following components:      Result Value   Color, UA straw (*)    Clarity, UA cloudy (*)    Blood, UA small (*)    All other components within normal limits  URINE CULTURE  POCT URINE PREGNANCY    EKG   Radiology No results found.  Procedures Procedures (including critical care time)  Medications Ordered in UC Medications - No data to display  Initial Impression / Assessment and Plan / UC Course  I have reviewed the  triage vital signs and the nursing notes.  Pertinent labs & imaging results that  were available during my care of the patient were reviewed by me and considered in my medical decision making (see chart for details).    Acute cystitis, Urine Culture pending. Macrobid 100 mg twice daily for 5 days. Force fluids, return if symptoms worsen or do not improve. Final Clinical Impressions(s) / UC Diagnoses   Final diagnoses:  Acute cystitis without hematuria     Discharge Instructions      Based on symptoms will treat for suspected UTI while your urine culture is pending. Start Macrobid 100 mg twice daily for 5 days. Hydrate well with water.  Our office will contact you if your urine culture results indicate a need for change in treatment.     ED Prescriptions     Medication Sig Dispense Auth. Provider   nitrofurantoin, macrocrystal-monohydrate, (MACROBID) 100 MG capsule Take 1 capsule (100 mg total) by mouth 2 (two) times daily. 10 capsule Bing Neighbors, NP      PDMP not reviewed this encounter.   Bing Neighbors, NP 03/26/23 1008

## 2023-03-27 LAB — URINE CULTURE
Culture: >=100000 — AB
Special Requests: NORMAL

## 2023-03-28 LAB — URINE CULTURE

## 2023-07-12 ENCOUNTER — Telehealth: Payer: Self-pay | Admitting: Nurse Practitioner

## 2023-07-12 NOTE — Telephone Encounter (Signed)
Lvmtcb to schedule an appt

## 2024-02-20 ENCOUNTER — Encounter: Payer: Self-pay | Admitting: Nurse Practitioner

## 2024-03-29 ENCOUNTER — Encounter: Payer: Self-pay | Admitting: Advanced Practice Midwife
